# Patient Record
Sex: Male | Born: 1935 | Race: White | Hispanic: No | Marital: Married | Smoking: Former smoker
Health system: Southern US, Community
[De-identification: ages and names within clinical notes are randomized; demographics above are authoritative.]

## PROBLEM LIST (undated history)

## (undated) DIAGNOSIS — R42 Dizziness and giddiness: Secondary | ICD-10-CM

## (undated) DIAGNOSIS — Z720 Tobacco use: Secondary | ICD-10-CM

## (undated) DIAGNOSIS — I472 Ventricular tachycardia: Secondary | ICD-10-CM

## (undated) DIAGNOSIS — I428 Other cardiomyopathies: Secondary | ICD-10-CM

## (undated) DIAGNOSIS — K219 Gastro-esophageal reflux disease without esophagitis: Secondary | ICD-10-CM

## (undated) DIAGNOSIS — F1010 Alcohol abuse, uncomplicated: Secondary | ICD-10-CM

## (undated) DIAGNOSIS — I4729 Other ventricular tachycardia: Secondary | ICD-10-CM

## (undated) DIAGNOSIS — N189 Chronic kidney disease, unspecified: Secondary | ICD-10-CM

## (undated) DIAGNOSIS — I480 Paroxysmal atrial fibrillation: Secondary | ICD-10-CM

## (undated) DIAGNOSIS — I10 Essential (primary) hypertension: Secondary | ICD-10-CM

## (undated) DIAGNOSIS — I5022 Chronic systolic (congestive) heart failure: Secondary | ICD-10-CM

## (undated) DIAGNOSIS — R001 Bradycardia, unspecified: Secondary | ICD-10-CM

## (undated) DIAGNOSIS — J449 Chronic obstructive pulmonary disease, unspecified: Secondary | ICD-10-CM

## (undated) DIAGNOSIS — I2510 Atherosclerotic heart disease of native coronary artery without angina pectoris: Secondary | ICD-10-CM

## (undated) HISTORY — DX: Alcohol abuse, uncomplicated: F10.10

## (undated) HISTORY — PX: CHOLECYSTECTOMY: SHX55

## (undated) HISTORY — DX: Atherosclerotic heart disease of native coronary artery without angina pectoris: I25.10

## (undated) HISTORY — DX: Ventricular tachycardia: I47.2

## (undated) HISTORY — DX: Tobacco use: Z72.0

## (undated) HISTORY — DX: Chronic obstructive pulmonary disease, unspecified: J44.9

## (undated) HISTORY — DX: Dizziness and giddiness: R42

## (undated) HISTORY — DX: Other ventricular tachycardia: I47.29

## (undated) HISTORY — DX: Gastro-esophageal reflux disease without esophagitis: K21.9

## (undated) HISTORY — DX: Essential (primary) hypertension: I10

## (undated) HISTORY — DX: Chronic systolic (congestive) heart failure: I50.22

## (undated) HISTORY — PX: CARDIOVERSION: SHX1299

---

## 1997-06-25 ENCOUNTER — Other Ambulatory Visit: Admission: RE | Admit: 1997-06-25 | Discharge: 1997-06-25 | Payer: Self-pay | Admitting: *Deleted

## 2001-06-01 ENCOUNTER — Encounter (INDEPENDENT_AMBULATORY_CARE_PROVIDER_SITE_OTHER): Payer: Self-pay | Admitting: Specialist

## 2001-06-01 ENCOUNTER — Ambulatory Visit (HOSPITAL_COMMUNITY): Admission: RE | Admit: 2001-06-01 | Discharge: 2001-06-01 | Payer: Self-pay | Admitting: Gastroenterology

## 2007-06-25 ENCOUNTER — Ambulatory Visit: Payer: Self-pay | Admitting: Internal Medicine

## 2007-06-25 ENCOUNTER — Inpatient Hospital Stay (HOSPITAL_COMMUNITY): Admission: EM | Admit: 2007-06-25 | Discharge: 2007-07-05 | Payer: Self-pay | Admitting: Emergency Medicine

## 2007-06-26 ENCOUNTER — Encounter (INDEPENDENT_AMBULATORY_CARE_PROVIDER_SITE_OTHER): Payer: Self-pay | Admitting: Internal Medicine

## 2007-06-26 HISTORY — PX: CARDIAC CATHETERIZATION: SHX172

## 2007-07-02 ENCOUNTER — Encounter: Payer: Self-pay | Admitting: Cardiovascular Disease

## 2007-08-01 ENCOUNTER — Ambulatory Visit (HOSPITAL_COMMUNITY): Admission: RE | Admit: 2007-08-01 | Discharge: 2007-08-01 | Payer: Self-pay | Admitting: Cardiology

## 2007-08-13 HISTORY — PX: INSERT / REPLACE / REMOVE PACEMAKER: SUR710

## 2007-08-29 ENCOUNTER — Ambulatory Visit (HOSPITAL_COMMUNITY): Admission: RE | Admit: 2007-08-29 | Discharge: 2007-08-31 | Payer: Self-pay | Admitting: Cardiology

## 2007-10-01 ENCOUNTER — Ambulatory Visit (HOSPITAL_COMMUNITY): Admission: RE | Admit: 2007-10-01 | Discharge: 2007-10-01 | Payer: Self-pay | Admitting: Cardiology

## 2007-10-10 ENCOUNTER — Encounter: Admission: RE | Admit: 2007-10-10 | Discharge: 2007-10-10 | Payer: Self-pay | Admitting: Cardiology

## 2007-12-21 ENCOUNTER — Ambulatory Visit: Payer: Self-pay | Admitting: Vascular Surgery

## 2008-04-25 ENCOUNTER — Encounter: Admission: RE | Admit: 2008-04-25 | Discharge: 2008-04-25 | Payer: Self-pay | Admitting: Cardiology

## 2008-09-19 IMAGING — CT CT HEAD W/O CM
1 series · 15 of 28 positions shown, 19 images · non-contrast
Comparison: None.

CLINICAL DATA: 83-year-old male with vertigo and unsteady gait
since June 2007.  Headaches.  Pacemaker.

CT HEAD WITHOUT CONTRAST
TECHNIQUE: Contiguous axial images were obtained from the base of
the skull through the vertex without contrast.

[Series 32: 3d filtered head · axial · 0.49mm/px · z∈[+24,+161]mm · 15 of 28 slices shown, 19 images]
[im 2/28  brain]
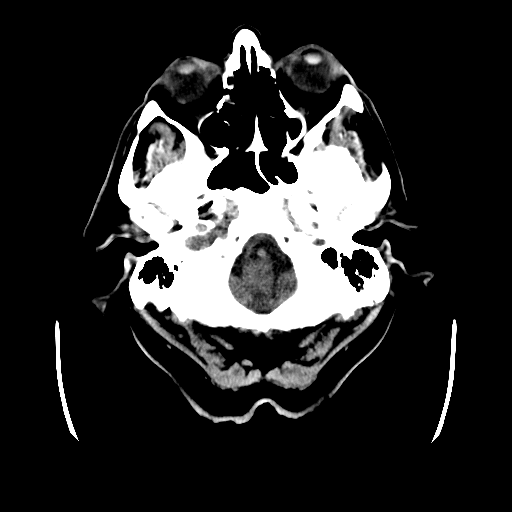
[im 2/28  bone]
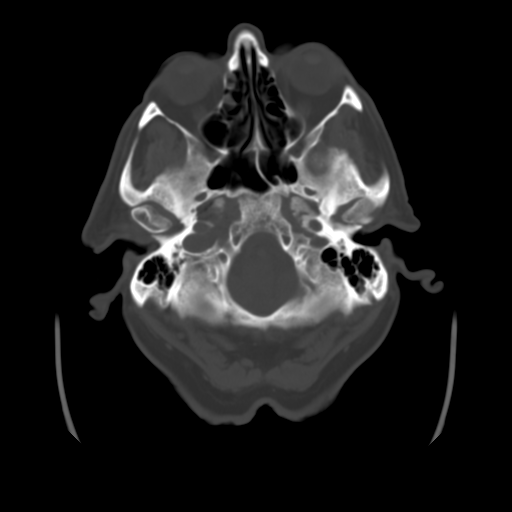
[im 4/28  brain]
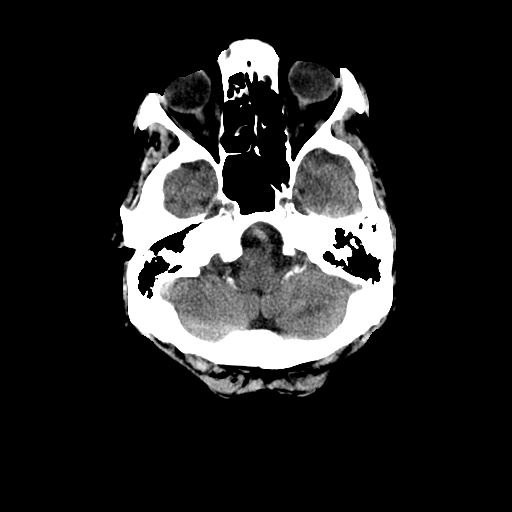
[im 6/28  brain]
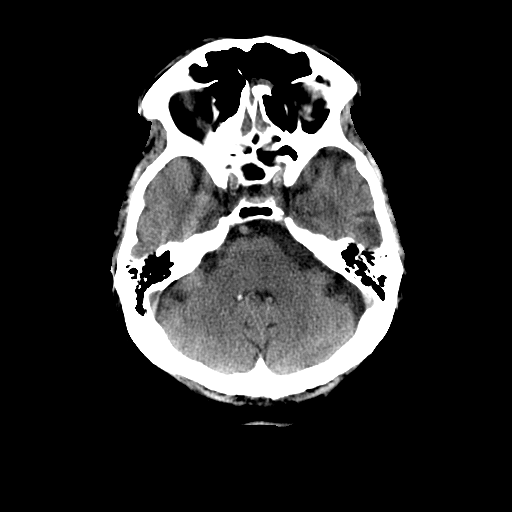
[im 8/28  brain]
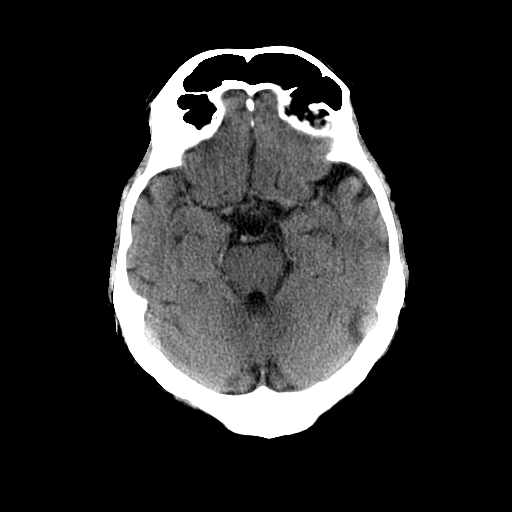
[im 9/28  brain]
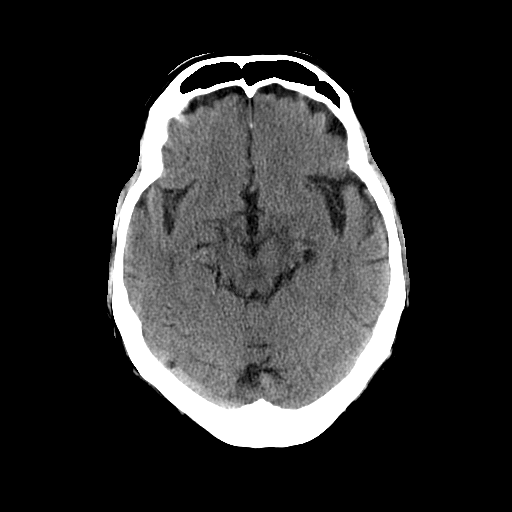
[im 9/28  bone]
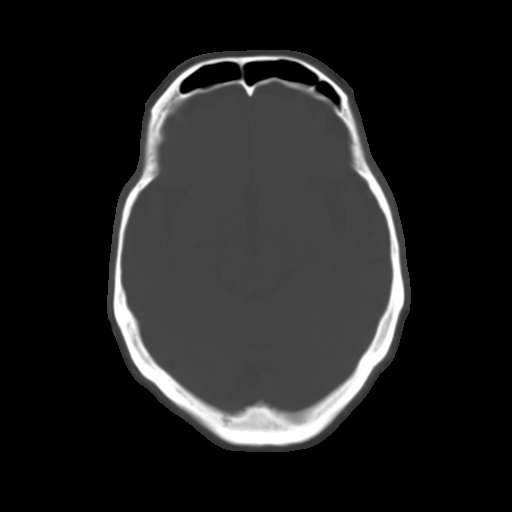
[im 11/28  brain]
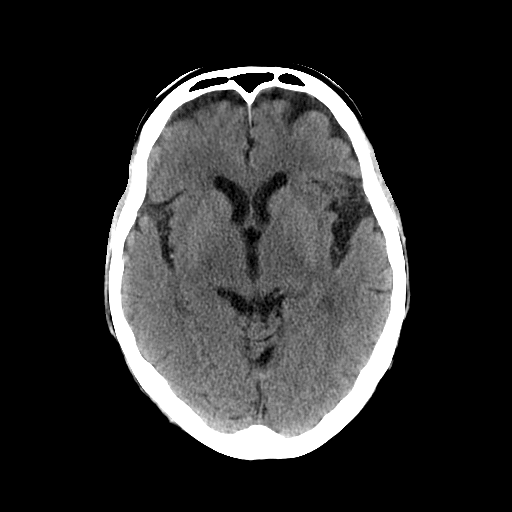
[im 13/28  brain]
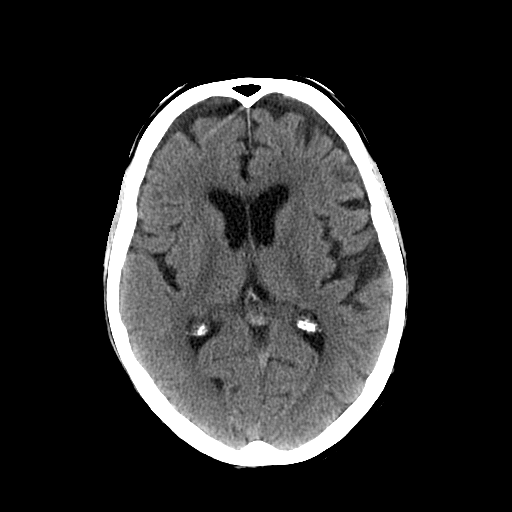
[im 15/28  brain]
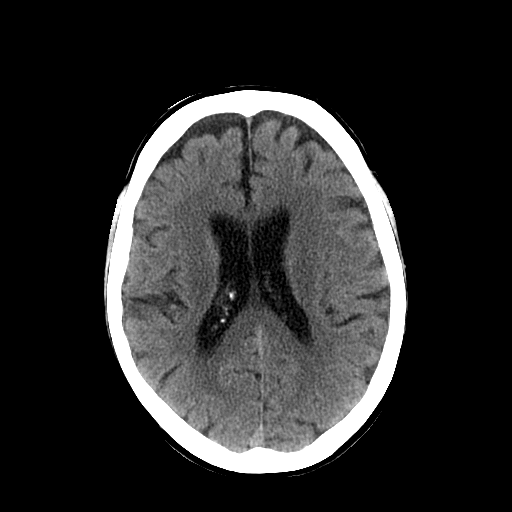
[im 16/28  brain]
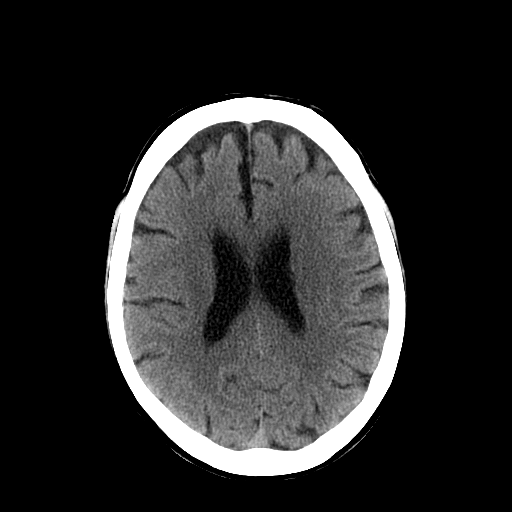
[im 16/28  bone]
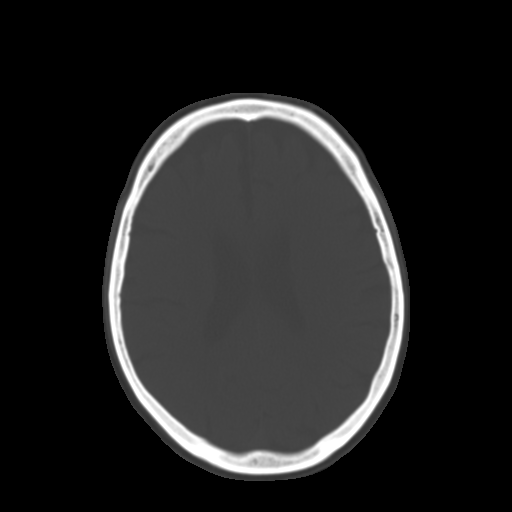
[im 18/28  brain]
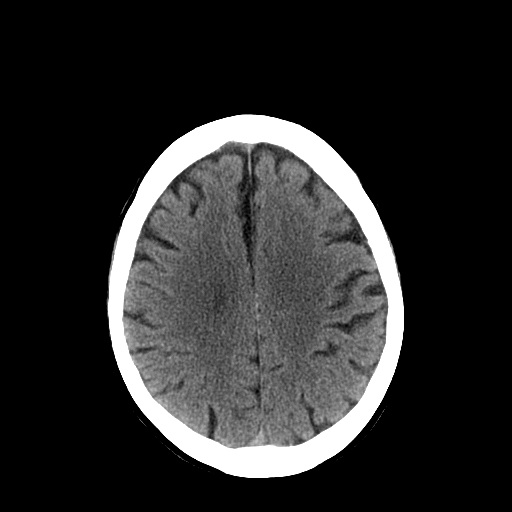
[im 20/28  brain]
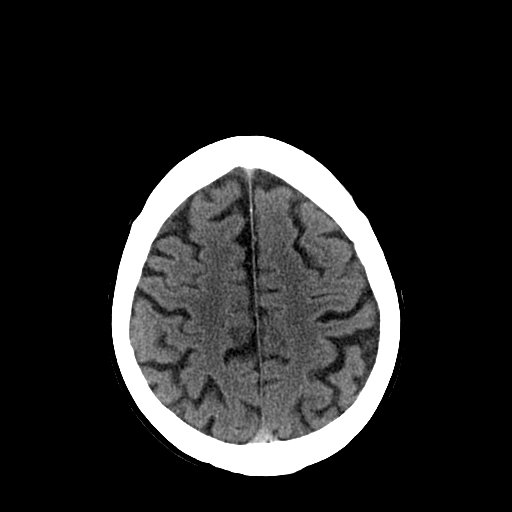
[im 21/28  brain]
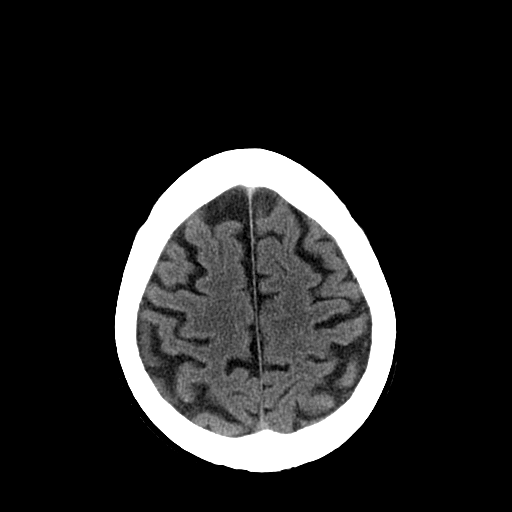
[im 23/28  brain]
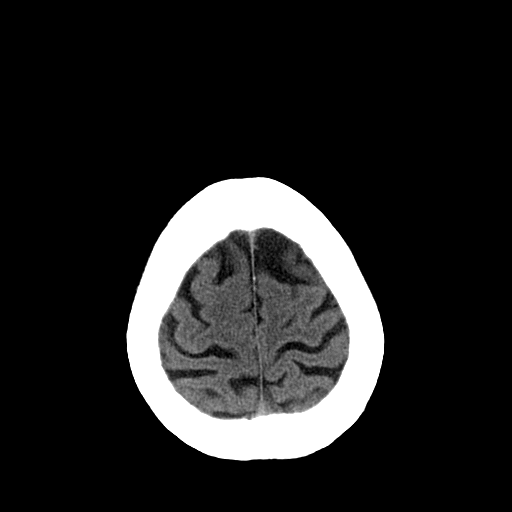
[im 23/28  bone]
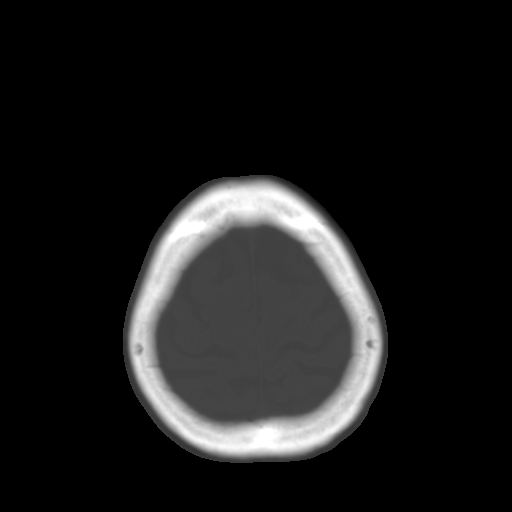
[im 25/28  brain]
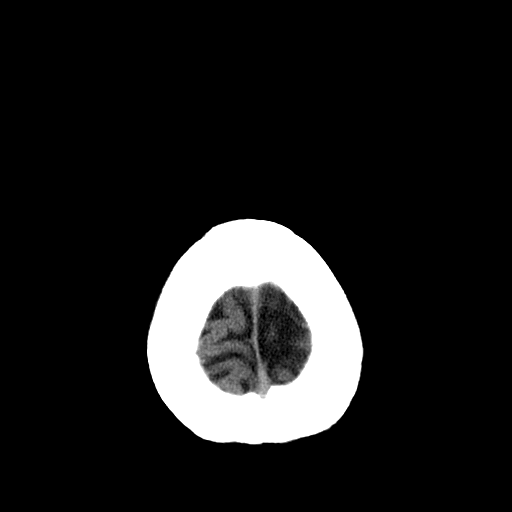
[im 27/28  brain]
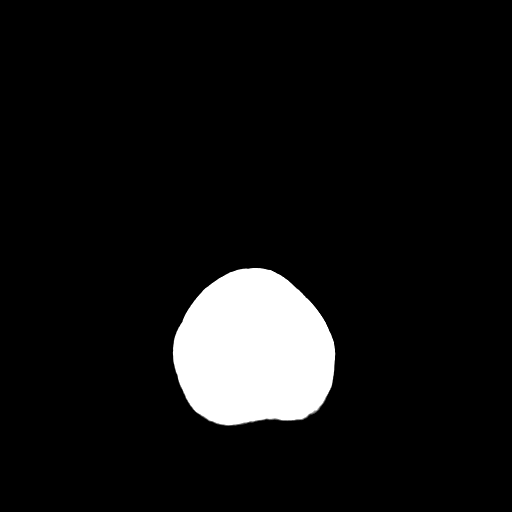

[15 of 28 positions shown; findings below may reference images not displayed]

FINDINGS: Visualized orbits and scalp soft tissues are within
normal limits.  Mild carotid siphon vascular calcifications.
Mucosal thickening in the frontal ethmoidal recesses, left greater
than right.  Scattered mucosal thickening in the visualized
ethmoids.  Sphenoid and visualized maxillary sinuses are clear as
are the visualized mastoid air cells.  No acute osseous
abnormality.

The mild hypodensity in the left pons compatible with lacunar
infarct versus dilated perivascular space.  Mild tortuosity of the
vertebrobasilar junction.  Otherwise normal gray-white matter
differentiation in the brainstem and cerebellum.  Cerebral volume
within normal limits for age.  No midline shift or
ventriculomegaly.  No mass effect. No suspicious intracranial
vascular hyperdensity.   No evidence of acute cortically based
infarct identified.
IMPRESSION: 1. No acute intracranial abnormality.
2.  Evidence of mild small vessel disease with including brain stem
involvement.
3.  Frontal ethmoidal sinus inflammatory changes.

## 2009-01-08 ENCOUNTER — Encounter: Admission: RE | Admit: 2009-01-08 | Discharge: 2009-01-08 | Payer: Self-pay | Admitting: General Surgery

## 2009-01-12 ENCOUNTER — Encounter (INDEPENDENT_AMBULATORY_CARE_PROVIDER_SITE_OTHER): Payer: Self-pay | Admitting: General Surgery

## 2009-01-12 ENCOUNTER — Ambulatory Visit (HOSPITAL_BASED_OUTPATIENT_CLINIC_OR_DEPARTMENT_OTHER): Admission: RE | Admit: 2009-01-12 | Discharge: 2009-01-12 | Payer: Self-pay | Admitting: General Surgery

## 2009-03-14 HISTORY — PX: TRANSTHORACIC ECHOCARDIOGRAM: SHX275

## 2009-03-14 HISTORY — PX: CARDIOVASCULAR STRESS TEST: SHX262

## 2009-04-06 ENCOUNTER — Encounter: Admission: RE | Admit: 2009-04-06 | Discharge: 2009-04-06 | Payer: Self-pay | Admitting: Cardiology

## 2009-12-19 ENCOUNTER — Encounter: Payer: Self-pay | Admitting: Internal Medicine

## 2010-01-04 ENCOUNTER — Ambulatory Visit: Payer: Self-pay | Admitting: Internal Medicine

## 2010-03-30 ENCOUNTER — Encounter: Payer: Self-pay | Admitting: Internal Medicine

## 2010-04-09 ENCOUNTER — Encounter: Payer: Self-pay | Admitting: Internal Medicine

## 2010-04-09 ENCOUNTER — Ambulatory Visit
Admission: RE | Admit: 2010-04-09 | Discharge: 2010-04-09 | Payer: Self-pay | Source: Home / Self Care | Attending: Internal Medicine | Admitting: Internal Medicine

## 2010-04-09 DIAGNOSIS — I10 Essential (primary) hypertension: Secondary | ICD-10-CM | POA: Insufficient documentation

## 2010-04-09 DIAGNOSIS — I472 Ventricular tachycardia: Secondary | ICD-10-CM | POA: Insufficient documentation

## 2010-04-09 DIAGNOSIS — I4891 Unspecified atrial fibrillation: Secondary | ICD-10-CM | POA: Insufficient documentation

## 2010-04-09 DIAGNOSIS — I5022 Chronic systolic (congestive) heart failure: Secondary | ICD-10-CM | POA: Insufficient documentation

## 2010-04-09 DIAGNOSIS — I495 Sick sinus syndrome: Secondary | ICD-10-CM | POA: Insufficient documentation

## 2010-04-13 NOTE — Miscellaneous (Signed)
Summary: Device preload  Clinical Lists Changes  Observations: Added new observation of PPM INDICATN: Sick sinus syndrome (12/19/2009 15:17) Added new observation of MAGNET RTE: BOL 85 ERI 65 (12/19/2009 15:17) Added new observation of PPMLEADSTAT2: active (12/19/2009 15:17) Added new observation of PPMLEADSER2: 045409  (12/19/2009 15:17) Added new observation of PPMLEADMOD2: 4470  (12/19/2009 15:17) Added new observation of PPMLEADDOI2: 08/29/2007  (12/19/2009 15:17) Added new observation of PPMLEADLOC2: RV  (12/19/2009 15:17) Added new observation of PPMLEADSTAT1: active  (12/19/2009 15:17) Added new observation of PPMLEADSER1: 811914  (12/19/2009 15:17) Added new observation of PPMLEADMOD1: 4469  (12/19/2009 15:17) Added new observation of PPMLEADDOI1: 08/29/2007  (12/19/2009 15:17) Added new observation of PPMLEADLOC1: RA  (12/19/2009 15:17) Added new observation of PPM IMP MD: Duffy Rhody Tennant,MD  (12/19/2009 15:17) Added new observation of PPM DOI: 08/29/2007  (12/19/2009 15:17) Added new observation of PPM SERL#: [id number]  (12/19/2009 15:17) Added new observation of PPM MODL#: [id number]  (12/19/2009 15:17) Added new observation of PACEMAKERMFG: [employer]onic  (12/19/2009 15:17) Added new observation of PPM REFER MD: Roger Shelter, MD  (12/19/2009 15:17) Added new observation of PACEMAKER MD: Hillis Range, MD  (12/19/2009 15:17)      PPM Specifications Following MD:  Hillis Range, MD     Referring MD:  Roger Shelter, MD PPM Vendor:  [employer]tronic     PPM Model Number:  [id number]     PPM Serial Number:  [id number] PPM DOI:  08/29/2007     PPM Implanting MD:  Rolla Plate  Lead 1    Location: RA     DOI: 08/29/2007     Model #: 4469     Serial #: [id number]     Status: active Lead 2    Location: RV     DOI: 08/29/2007     Model #: 4470     Serial #: [id number]     Status: active  Magnet Response Rate:  BOL 85 ERI 65  Indications:  Sick sinus syndrome

## 2010-04-13 NOTE — Cardiovascular Report (Signed)
Summary: Office Visit   Office Visit   Imported By: Roderic Ovens 01/11/2010 16:05:12  _____________________________________________________________________  External Attachment:    Type:   Image     Comment:   External Document

## 2010-04-13 NOTE — Procedures (Signed)
Summary: pacer check/medtronic   Current Medications (verified): 1)  Aspirin 81 Mg Tbec (Aspirin) .... One By Mouth Daily 2)  Allopurinol 300 Mg Tabs (Allopurinol) .... One Half By Mouth Daily 3)  Amiodarone Hcl 200 Mg Tabs (Amiodarone Hcl) .... One By Mouth Daily 4)  Meclizine Hcl 25 Mg Tabs (Meclizine Hcl) .... One By Mouth Three Times A Day As Needed 5)  Furosemide 20 Mg Tabs (Furosemide) .... One By Mouth Daily 6)  Benazepril Hcl 20 Mg Tabs (Benazepril Hcl) .... One Half By Mouth Daily  Allergies (verified): No Known Drug Allergies  PPM Specifications Following MD:  Hillis Range, MD     Referring MD:  Roger Shelter, MD PPM Vendor:  Med[employer]onic     PPM Model Number:  [id number]     PPM Serial Number:  [id number] H PPM DOI:  08/29/2007     PPM Implanting MD:  Rolla Plate  Lead 1    Location: RA     DOI: 08/29/2007     Model #: 4469     Serial #: [id number]     Status: active Lead 2    Location: RV     DOI: 08/29/2007     Model #: 4470     Serial #: [id number]     Status: active  Magnet Response Rate:  BOL 85 ERI 65  Indications:  Sick sinus syndrome   PPM Follow Up Remote Check?  No Battery Voltage:  3.00 V     Pacer Dependent:  No       PPM Device Measurements Atrium  Amplitude: 1.5 mV, Impedance: 448 ohms, Threshold: 0.5 V at 0.4 msec Right Ventricle  Amplitude: 9.0 mV, Impedance: 416 ohms, Threshold: 0.5 V at 0.4 msec  Episodes MS Episodes:  0     Percent Mode Switch:  0     Coumadin:  No Ventricular High Rate:  0     Atrial Pacing:  96.4%     Ventricular Pacing:  2.3%  Parameters Mode:  DDDR+     Lower Rate Limit:  60     Upper Rate Limit:  130 Paced AV Delay:  200     Sensed AV Delay:  180 Next Cardiology Appt Due:  03/14/2010 Tech Comments:  No parameter changes.  Device function normal.  New [place] [facility] patient.  ROV 3 months with Dr. Johney Frame.  We will set up [facility] @ that visit. Altha Harm, LPN  January 04, 2010 2:58 PM

## 2010-04-15 NOTE — Assessment & Plan Note (Signed)
Summary: device/saf   Visit Type:  Pacemaker check Referring Provider:  Dr Deborah Chalk Primary Provider:  Dr Valentina Lucks   History of Present Illness: Mark Hess is a pleasant 77 yo WM with a h/o persistent atrial fibrillation s/p repeat cardioversions, bradycardia s/p PPM, nonischemic CM (EF 35-40%) and HTN who presents today to initiate care in the EP clinic.  He is sp MDT PPM 2009 for symptomatic bradycardia.  He appears to be doing reasonably well from a cardiac standpoint.  He has occasional episodes of headaches with "head fullness" and dizziness.  These episodes occur in the evenings and resolve with meclizine.  He is otherwise doing well.  He denies symptoms of palpitations, chest pain, shortness of breath, orthopnea, PND, lower extremity edema, presyncope, syncope, or neurologic sequela. The patient is tolerating medications without difficulties and is otherwise without complaint today.   Current Medications (verified): 1)  Aspirin 81 Mg Tbec (Aspirin) .... One By Mouth Daily 2)  Allopurinol 300 Mg Tabs (Allopurinol) .... One Half By Mouth Daily 3)  Amiodarone Hcl 200 Mg Tabs (Amiodarone Hcl) .... One By Mouth Daily 4)  Meclizine Hcl 25 Mg Tabs (Meclizine Hcl) .... One By Mouth Three Times A Day As Needed 5)  Furosemide 20 Mg Tabs (Furosemide) .... One By Mouth Daily 6)  Benazepril Hcl 20 Mg Tabs (Benazepril Hcl) .... One Half By Mouth Daily  Allergies (verified): No Known Drug Allergies  Past History:  Past Medical History: Atrial Fibrillation C O P D Diabetes Type 2 Hypertension nonischemic CM (EF 35-40%) NYHA CLass II CHF bradycardia s/p PPM  Past Surgical History: sp PPM (MDT) by Dr Deborah Chalk 08/29/07  Social History: Reviewed history from 04/09/2010 and no changes required. Married  Tobacco Use - Yes.  Alcohol Use - yes -- occasional Drug Use - no  Review of Systems       All systems are reviewed and negative except as listed in the HPI.   Vital Signs:  Patient  profile:   77 year old male Height:      69 inches Weight:      149 pounds BMI:     22.08 Pulse rate:   79 / minute BP sitting:   142 / 80  (left arm) Cuff size:   regular  Vitals Entered By: Mark Hess, RMA (April 09, 2010 10:37 AM)  Physical Exam  General:  elderly, NAD Head:  normocephalic and atraumatic Eyes:  PERRLA/EOM intact; conjunctiva and lids normal. Mouth:  Teeth, gums and palate normal. Oral mucosa normal. Neck:  supple Chest Wall:  R sided pacemaker is well healed Lungs:  Clear bilaterally to auscultation and percussion. Heart:  Non-displaced PMI, chest non-tender; regular rate and rhythm, S1, S2 without murmurs, rubs or gallops. Carotid upstroke normal, no bruit. Normal abdominal aortic size, no bruits. Femorals normal pulses, no bruits. Pedals normal pulses. No edema, no varicosities. Abdomen:  Bowel sounds positive; abdomen soft and non-tender without masses, organomegaly, or hernias noted. No hepatosplenomegaly. Msk:  Back normal, normal gait. Muscle strength and tone normal. Extremities:  No clubbing or cyanosis. Neurologic:  Alert and oriented x 3.   PPM Specifications Following MD:  Mark Range, MD     Referring MD:  Mark Shelter, MD PPM Vendor:  [employer]onic     PPM Model Number:  [id number]     PPM Serial Number:  [id number] H PPM DOI:  08/29/2007     PPM Implanting MD:  Mark Rhody Tennant,MD  Lead 1    Location: RA  DOI: 08/29/2007     Model #: 1610     Serial #: [id number]     Status: active Lead 2    Location: RV     DOI: 08/29/2007     Model #: 4470     Serial #: [id number]     Status: active  Magnet Response Rate:  BOL 85 ERI 65  Indications:  Sick sinus syndrome   PPM Follow Up Battery Voltage:  2.99 V     Pacer Dependent:  No       PPM Device Measurements Atrium  Amplitude: 1.8 mV, Impedance: 520 ohms, Threshold: 1.0 V at 0.4 msec Right Ventricle  Amplitude: 9.7 mV, Impedance: 424 ohms, Threshold: 1.0 V at 0.4 msec  Episodes MS Episodes:  0      Coumadin:  No Ventricular High Rate:  3     Atrial Pacing:  96.9%     Ventricular Pacing:  1.7%  Parameters Mode:  MVP     Lower Rate Limit:  60     Upper Rate Limit:  130 Paced AV Delay:  200     Sensed AV Delay:  180 Next Remote Date:  07/08/2010     Next Cardiology Appt Due:  03/15/2011 Tech Comments:  3 MONITORED VT EPISODES--LONGEST WAS 10 BEATS.  NORMAL DEVICE FUNCTION.  HISTOGRAM APPROPRIATE FOR PT--PT HAS NO COMPLAINTS W/FATIGUE OR SOB.  NO CHANGES MADE. [facility] 07-08-10 AND ROV IN 12 MTHS W/JA. Mark Hess  April 09, 2010 10:41 AM MD Comments:  agree nonsustained VT but no sustained arrhythmias normal pacemaker function  Impression & Recommendations:  Problem # 1:  ATRIAL FIBRILLATION (ICD-427.31)  controlled with amiodarone his CHADS2 score is 66 (age, CHF, HTN) he should be on coumadin if not contraindicated for stroke prevention I will defer this decision to Dr Deborah Chalk.  His updated medication list for this problem includes:    Aspirin 81 Mg Tbec (Aspirin) ..... One by mouth daily    Amiodarone Hcl 200 Mg Tabs (Amiodarone hcl) ..... One by mouth daily  Problem # 2:  HYPERTENSION, UNSPECIFIED (ICD-401.9)  stable no changes today  His updated medication list for this problem includes:    Aspirin 81 Mg Tbec (Aspirin) ..... One by mouth daily    Furosemide 20 Mg Tabs (Furosemide) ..... One by mouth daily    Benazepril Hcl 20 Mg Tabs (Benazepril hcl) ..... One half by mouth daily  Problem # 3:  CHRONIC SYSTOLIC HEART FAILURE (ICD-428.22)  stable without significant CHF no changes given advanced age, he is not a candidate for ICD for primary prevention of sudden death  His updated medication list for this problem includes:    Aspirin 81 Mg Tbec (Aspirin) ..... One by mouth daily    Amiodarone Hcl 200 Mg Tabs (Amiodarone hcl) ..... One by mouth daily    Furosemide 20 Mg Tabs (Furosemide) ..... One by mouth daily    Benazepril Hcl 20 Mg Tabs (Benazepril hcl)  ..... One half by mouth daily  Problem # 4:  SINOATRIAL NODE DYSFUNCTION (ICD-427.81)  normal pacemaker function no changes today  dizziness and "head fullness" are not likely cardiac in etiology He will follow-up with PCP regarding these symptoms.  His updated medication list for this problem includes:    Aspirin 81 Mg Tbec (Aspirin) ..... One by mouth daily    Amiodarone Hcl 200 Mg Tabs (Amiodarone hcl) ..... One by mouth daily    Benazepril Hcl 20 Mg Tabs (Benazepril hcl) ..... One half  by mouth daily  Patient Instructions: 1)  Your physician wants you to follow-up in: 12 months with Dr Mark Hess will receive a reminder letter in the mail two months in advance. If you don't receive a letter, please call our office to schedule the follow-up appointment. 2)  Your physician recommends that you continue on your current medications as directed. Please refer to the Current Medication list given to you today.

## 2010-04-21 NOTE — Cardiovascular Report (Signed)
Summary: Office Visit   Office Visit   Imported By: Roderic Ovens 04/14/2010 15:14:56  _____________________________________________________________________  External Attachment:    Type:   Image     Comment:   External Document

## 2010-04-29 NOTE — Letter (Signed)
Summary: [facility] Progress Note   [facility] Progress Note   Imported By: Roderic Ovens 04/23/2010 15:16:45  _____________________________________________________________________  External Attachment:    Type:   Image     Comment:   External Document

## 2010-05-17 ENCOUNTER — Emergency Department (HOSPITAL_COMMUNITY): Payer: Medicare Other

## 2010-05-17 ENCOUNTER — Observation Stay (HOSPITAL_COMMUNITY)
Admission: EM | Admit: 2010-05-17 | Discharge: 2010-05-18 | DRG: 552 | Disposition: A | Discharge status: Home / Self Care | Payer: Medicare Other | Attending: Internal Medicine | Admitting: Internal Medicine

## 2010-05-17 DIAGNOSIS — I2510 Atherosclerotic heart disease of native coronary artery without angina pectoris: Secondary | ICD-10-CM | POA: Insufficient documentation

## 2010-05-17 DIAGNOSIS — I10 Essential (primary) hypertension: Secondary | ICD-10-CM | POA: Insufficient documentation

## 2010-05-17 DIAGNOSIS — E119 Type 2 diabetes mellitus without complications: Secondary | ICD-10-CM | POA: Insufficient documentation

## 2010-05-17 DIAGNOSIS — I252 Old myocardial infarction: Secondary | ICD-10-CM | POA: Insufficient documentation

## 2010-05-17 DIAGNOSIS — F17200 Nicotine dependence, unspecified, uncomplicated: Secondary | ICD-10-CM | POA: Insufficient documentation

## 2010-05-17 DIAGNOSIS — M531 Cervicobrachial syndrome: Principal | ICD-10-CM | POA: Insufficient documentation

## 2010-05-17 LAB — BASIC METABOLIC PANEL
BUN: 36 mg/dL — ABNORMAL HIGH (ref 6–23)
Calcium: 9.1 mg/dL (ref 8.4–10.5)
GFR calc non Af Amer: 24 mL/min — ABNORMAL LOW (ref >60)
Glucose, Bld: 126 mg/dL — ABNORMAL HIGH (ref 70–99)
Sodium: 135 mEq/L (ref 135–145)

## 2010-05-17 LAB — CK TOTAL AND CKMB (NOT AT ARMC)
CK, MB: 3.8 ng/mL (ref 0.3–4.0)
Total CK: 40 U/L (ref 7–232)

## 2010-05-17 LAB — POCT I-STAT, CHEM 8
BUN: 40 mg/dL — ABNORMAL HIGH (ref 6–23)
Calcium, Ion: 1.23 mmol/L (ref 1.12–1.32)
Chloride: 113 mEq/L — ABNORMAL HIGH (ref 96–112)
Creatinine, Ser: 2.8 mg/dL — ABNORMAL HIGH (ref 0.4–1.5)
Glucose, Bld: 118 mg/dL — ABNORMAL HIGH (ref 70–99)
Potassium: 4.6 mEq/L (ref 3.5–5.1)

## 2010-05-17 LAB — CBC
Hemoglobin: 15.1 g/dL (ref 13.0–17.0)
MCHC: 32.8 g/dL (ref 30.0–36.0)

## 2010-05-17 LAB — DIFFERENTIAL
Basophils Absolute: 0.1 10*3/uL (ref 0.0–0.1)
Basophils Relative: 1 % (ref 0–1)
Neutro Abs: 5.8 10*3/uL (ref 1.7–7.7)
Neutrophils Relative %: 62 % (ref 43–77)

## 2010-05-17 LAB — POCT CARDIAC MARKERS: Troponin i, poc: <0.05 ng/mL (ref 0.00–0.09)

## 2010-05-17 LAB — TROPONIN I: Troponin I: 0.03 ng/mL (ref 0.00–0.06)

## 2010-05-18 LAB — CARDIAC PANEL(CRET KIN+CKTOT+MB+TROPI)
CK, MB: 3.6 ng/mL (ref 0.3–4.0)
Relative Index: INVALID (ref 0.0–2.5)
Total CK: 47 U/L (ref 7–232)
Troponin I: 0.03 ng/mL (ref 0.00–0.06)

## 2010-05-18 LAB — TSH: TSH: 1.852 u[IU]/mL (ref 0.350–4.500)

## 2010-05-18 LAB — GLUCOSE, CAPILLARY: Glucose-Capillary: 113 mg/dL — ABNORMAL HIGH (ref 70–99)

## 2010-05-21 NOTE — Discharge Summary (Signed)
  Mark Hess, DASCH             ACCOUNT NO.:  [id number]  MEDICAL RECORD NO.:  [id number]           PATIENT TYPE:  I  LOCATION:  2020                         FACILITY:  [facility]  PHYSICIAN:  Thora Lance, M.D.  DATE OF BIRTH:  1923/10/21  DATE OF ADMISSION:  05/17/2010 DATE OF DISCHARGE:  05/18/2010                              DISCHARGE SUMMARY   REASON FOR ADMISSION:  This is an 77 year old white male with history of diabetes, coronary artery disease and prior MI, and hypertension who presented with 1-day episode of severe burning in his left lateral neck radiating up behind his left ear towards the left head.  Each episode lasted about 20 minutes, but some were short of 2 minutes.  The pain was fairly severe.  He has had no fevers, chills, headache, sore throat, shortness of breath, or chest pain.  Workup in the ER consisted of negative chest x-ray, negative CT scan of the head and neck.  EKG was normal.  SIGNIFICANT FINDINGS:  VITAL SIGNS:  Blood pressure 140/80, heart rate 60, respirations 22, temperature 97.6. LUNGS:  Clear. HEART:  Regular rate and rhythm without murmur, gallop, or rub. ABDOMEN:  Benign. NEUROLOGIC:  Nonfocal.  OBJECTIVE FINDINGS:  CT scan of the head showed no acute intracranial findings.  CT scan of the cervical spine showed no fracture.  Cardiac enzymes were normal.  WBC 9.4, hemoglobin 15.1, platelet count 224,000. Sodium 142, potassium 4.6, creatinine 2.8, BUN 40.  Chest x-ray clear.  HOSPITAL COURSE:  The patient was admitted with acute left neck pain radiating to the left head.  He had cardiac enzymes which remained normal, indicating this was not an acute coronary syndrome.  He most likely has acute left neck pain, possible occipital neuralgia, rule out early herpes zoster.  He was given 60 mg of prednisone.  He will be seen again in my office in 1 day.  His creatinine was elevated at 2.8 which is higher than his baseline.  Benazepril and  furosemide were held.  He was asked to drink plenty of fluids in the next 24 hours and to avoid any nonsteroidal agents.  DISCHARGE DIAGNOSES: 1. Occipital neuralgia. 2. Neck pain. 3. Coronary artery disease. 4. Hypertension. 5. Diabetes mellitus, diet-controlled.  PROCEDURES: 1. CT scan of the brain. 2. CT scan of the neck.  DISCHARGE MEDICATIONS: 1. Zolpidem 5 mg one p.o. at bedtime. 2. Allopurinol 300 mg one-half tablet once a day. 3. Amiodarone 200 mg 1 tablet once in the a.m. 4. Aspirin 81 mg 1 tablet once in the morning. 5. Meclizine 25 mg 1 tablet t.i.d. p.r.n. 6. Furosemide and benazepril held at discharge.  DISPOSITION:  Discharged to home.  FOLLOWUP:  Follow up in 1 day with Dr. Valentina Lucks.  ACTIVITY:  As tolerated.  CODE STATUS:  Full code.  DIET:  Low-sodium diet.          ______________________________ Thora Lance, M.D.     JJG/MEDQ  D:  05/18/2010  T:  05/18/2010  Job:  [id number]  Electronically Signed by Kirby Funk M.D. on 05/21/2010 06:27:47 PM

## 2010-06-02 NOTE — H&P (Signed)
NAMEJERON, Mark Hess             ACCOUNT NO.:  [id number]  MEDICAL RECORD NO.:  [id number]           PATIENT TYPE:  E  LOCATION:  [facility]                         FACILITY:  [facility]  PHYSICIAN:  Houston Siren, MD           DATE OF BIRTH:  11-26-1923  DATE OF ADMISSION:  05/17/2010 DATE OF DISCHARGE:                             HISTORY & PHYSICAL   PRIMARY CARE PHYSICIAN:  Thora Lance, MD  CARDIOLOGIST:  Colleen Can. Deborah Chalk, MD  REASON FOR ADMISSION:  Left neck pain  ADVANCED DIRECTIVE:   FULL CODE  HISTORY OF PRESENT ILLNESS:  This is an 77 year old male with history of diabetes, coronary artery disease, prior MI, hypertension, and unfortunately active tobacco use, presents with several episodes of severe burning pain behind his left ear radiating towards his left head. He stated each episode lasted about 20 minutes and some as short as 2 minutes.  He had one episode of diaphoresis and slight nausea because of the pain.  He stated the pain is severe and burning in nature.  It is very similar to the pain he had with zoster except more severe.  He has no watery eyes, headache, or sore throat.  He denied any fever, chills, or any stiff neck.  He has no shortness of breath or chest pain.  Workup in emergency room included a negative chest x-ray, a negative CT of the head and neck.  Labs show a normal white count of 9.4 thousand, hemoglobin of 15.1, troponin less than 0.05.  His creatinine is 2.8, this is not new reportedly.  His EKG shows no acute ST-T changes.  There was concern that this might be acute coronary syndrome or anginal equivalent, and hospitalist was asked to admit the patient for rule out.  PAST MEDICAL HISTORY: 1. Coronary artery disease. 2. Hypertension. 3. Diabetes. 4. Myocardial infarction.  SOCIAL HISTORY:  He does not use tobacco, alcohol, or drugs.  ALLERGIES:  No known drug allergies.  MEDICATIONS:  He is unable to elaborate.  REVIEW OF SYSTEMS:   Otherwise unremarkable.  PHYSICAL EXAMINATION:  VITAL SIGNS:  Blood pressure 140/80, heart rate of 60, respiratory rate of 22, temperature 97.6. GENERAL:  He is alert and oriented and is in no apparent distress. HEENT AND NECK:  No facial asymmetry, and his speech is fluent.  Tongue is midline.  Uvula elevated with phonation.  Palpation of his left neck revealed no tenderness.  He has no rash.  Sclerae are nonicteric.  Neck is supple. CARDIAC:  S1 and S2 regular.  I did not hear any murmur, rub, or gallop. LUNGS:  Clear. ABDOMEN:  Slightly obese, nondistended, nontender. EXTREMITIES:  No edema.  Homans sign is negative.  He has good distal pulses bilaterally. SKIN:  Warm and dry. NEUROLOGIC:  Nonfocal.  He has good strength bilaterally.  OBJECTIVE FINDINGS:  CT of the head shows no acute intracranial finding. There is mild right maxillary sinusitis. The neck CT showed no evidence of cervical spine fracture. Troponin less than 0.05.  White count of 9.4 thousand, hemoglobin of 15.1, and platelet count 224,000.  Serum sodium  142, potassium 4.6, creatinine 2.8, BUN of 40. Chest x-ray is clear. EKG shows paced rhythm without any acute ST-T changes.  IMPRESSION:  This is an 77 year old male with no coronary artery disease presenting with left severe burning neck pain that is intermittent.  I suspect that this is a neuropathic pain as in tic [place].  I doubt that this is acute coronary syndrome or even anginal equivalent. Because of his known coronary artery disease, it is reasonable to admit him for rule out.  We will get serial CPKs and troponins.  I will use Dilaudid for his pain, but he is comfortable at this point.  The other possibility would be early zoster but I see no rash.  We will admit him to Dr. Jone Baseman service.  He is a full code.  We will hold off on steroids or other neuropathic pain medications such as Tegretol, tricyclic, etc., for now in hope that his pain will  resolve by itself.     Houston Siren, MD     PL/MEDQ  D:  05/17/2010  T:  05/17/2010  Job:  [id number]  cc:   Thora Lance, M.D.  Electronically Signed by Houston Siren  on 06/02/2010 01:46:56 AM

## 2010-06-17 LAB — DIFFERENTIAL
Eosinophils Absolute: 0.2 10*3/uL (ref 0.0–0.7)
Eosinophils Relative: 2 % (ref 0–5)
Lymphs Abs: 1.8 10*3/uL (ref 0.7–4.0)
Monocytes Relative: 5 % (ref 3–12)

## 2010-06-17 LAB — COMPREHENSIVE METABOLIC PANEL
ALT: 25 U/L (ref 0–53)
AST: 25 U/L (ref 0–37)
CO2: 26 mEq/L (ref 19–32)
Calcium: 9.6 mg/dL (ref 8.4–10.5)
GFR calc Af Amer: 38 mL/min — ABNORMAL LOW (ref >60)
GFR calc non Af Amer: 32 mL/min — ABNORMAL LOW (ref >60)
Sodium: 140 mEq/L (ref 135–145)

## 2010-06-17 LAB — CBC
MCHC: 33.2 g/dL (ref 30.0–36.0)
RBC: 4.85 MIL/uL (ref 4.22–5.81)
WBC: 7.3 10*3/uL (ref 4.0–10.5)

## 2010-07-08 ENCOUNTER — Encounter: Payer: Self-pay | Admitting: *Deleted

## 2010-07-11 ENCOUNTER — Encounter: Payer: Self-pay | Admitting: *Deleted

## 2010-07-12 ENCOUNTER — Other Ambulatory Visit: Payer: Self-pay | Admitting: *Deleted

## 2010-07-12 DIAGNOSIS — R42 Dizziness and giddiness: Secondary | ICD-10-CM

## 2010-07-12 MED ORDER — MECLIZINE HCL 25 MG PO TABS: 25.0000 mg | ORAL_TABLET | Freq: Three times a day (TID) | ORAL | Status: DC | PRN

## 2010-07-14 ENCOUNTER — Other Ambulatory Visit: Payer: Self-pay | Admitting: Internal Medicine

## 2010-07-15 ENCOUNTER — Ambulatory Visit (INDEPENDENT_AMBULATORY_CARE_PROVIDER_SITE_OTHER): Payer: Medicare Other | Admitting: *Deleted

## 2010-07-15 DIAGNOSIS — I495 Sick sinus syndrome: Secondary | ICD-10-CM

## 2010-07-15 DIAGNOSIS — I4891 Unspecified atrial fibrillation: Secondary | ICD-10-CM

## 2010-07-15 DIAGNOSIS — I472 Ventricular tachycardia: Secondary | ICD-10-CM

## 2010-07-21 ENCOUNTER — Encounter: Payer: Self-pay | Admitting: *Deleted

## 2010-07-22 NOTE — Progress Notes (Signed)
Pacer remote check  

## 2010-07-27 NOTE — H&P (Signed)
Mark Hess, Mark Hess             ACCOUNT NO.:  [id number]   MEDICAL RECORD NO.:  [id number]          PATIENT TYPE:  OIB   LOCATION:  2899                         FACILITY:  [facility]   PHYSICIAN:  Colleen Can. Deborah Chalk, M.D.DATE OF BIRTH:  [birth year]   DATE OF ADMISSION:  08/29/2007  DATE OF DISCHARGE:                              HISTORY & PHYSICAL   CHIEF COMPLAINT:  Palpitations in the setting of bradycardia.   HISTORY OF PRESENT ILLNESS:  Mr. 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  He has had LV dysfunction, as well as atrial  fibrillation.  He underwent a successful cardioversion on Aug 01, 2007.  Since that time he has continued to have complaints of palpitations, as  well as PVCs and PACs.  He has also continued to have significant  dizziness, has been associated with decrease in blood pressure.  Medicines have been very difficult to be titrated in light of his  bradycardia and he now presents for dual-chamber pacemaker implantation.   PAST MEDICAL HISTORY:  1. Lengthy hospitalization in April 2009, for nonischemic      cardiomyopathy.  He underwent cardiac catheterization at that time      that showed mild coronary disease with severe global LV      dysfunction.  2. History of nonsustained ventricular tachycardia in the past, he has      had a shock vest in place.  3. LV systolic dysfunction.  His ejection fraction is currently 30-      40%.  4. Past atrial fibrillation status post successful cardioversion, Aug 01, 2007.  5. History of alcohol abuse.  6. Coumadin anticoagulation.  7. Diabetes.  8. Hypertension.  9. Ongoing tobacco abuse.  10.Positive family history of heart disease.  11.Chronic obstructive pulmonary disease.  12.Questionable claudication.  13.History of cholecystectomy.  14.Anxiety.   ALLERGIES:  None.   CURRENT MEDICINES:  1. Benazepril 20 mg half a tablet daily.  2.  Lasix 20 mg every other day.  3. Coumadin 5 mg 6 days a week 2.5 mg x1.  4. Ambien 10 mg at bedtime.  5. Xanax p.r.n.Marland Kitchen   FAMILY HISTORY:  Unchanged.   SOCIAL HISTORY:  Unchanged.   REVIEW OF SYSTEMS:  He has been seen back in the office several times  since his cardioversion and continues to have lightheadedness and  dizziness in the setting of bradycardia with PACs and PVCs.  Medicines  have been decreased to no avail.  His shock test has been discontinued.  He has had no frank syncope.   PHYSICAL EXAM:  GENERAL:  He is anxious white male.  VITAL SIGNS:  His blood pressure is 112/68 sitting, 116/70 standing,  weight is 148.5 pounds, heart rate is 56, and regular, respirations 80,  he is afebrile.  SKIN:  Warm and dry.  Color is suntanned.  LUNGS:  Decreased breath sounds.  CARDIAC EXAM:  Regular rhythm.  ABDOMEN: Soft, positive bowel sounds, nontender.  EXTREMITIES:  Without edema.  NEUROLOGIC:  No gross focal deficits.   Pertinent labs  are pending.   IMPRESSION:  1. Symptomatic bradycardia.  2. Nonischemic cardiomyopathy.  3. History of ventricular tachycardia.  4. History of atrial fibrillation maintaining sinus rhythm since      cardioversion in May 2009.  5. History of alcohol use.  6. Mild coronary disease.  7. Diabetes.  8. Hypertension.  9. Ongoing tobacco abuse.  10.Chronic Coumadin therapy.   PLAN:  Will proceed on with dual-chamber pacemaker implantation.  Patient's Coumadin will be held appropriately.  Further treatment plan  to follow up per Dr. Deborah Chalk discretion.      Sharlee Blew, N.P.      Colleen Can. Deborah Chalk, M.D.  Electronically Signed    LC/MEDQ  D:  08/24/2007  T:  08/25/2007  Job:  [id number]

## 2010-07-27 NOTE — H&P (Signed)
Mark Hess, Mark Hess             ACCOUNT NO.:  [id number]   MEDICAL RECORD NO.:  [id number]          PATIENT TYPE:  INP   LOCATION:  3742                         FACILITY:  [facility]   PHYSICIAN:  Colleen Can. Deborah Chalk, M.D.DATE OF BIRTH:  1923/09/26   DATE OF ADMISSION:  06/25/2007  DATE OF DISCHARGE:  07/05/2007                              HISTORY & PHYSICAL   CHIEF COMPLAINT:  Fatigue, palpitations, and shortness of breath.   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  He was admitted in  April of this year with an episode of substernal fullness in his chest  with shortness of breath and diaphoresis.  He underwent cardiac  catheterization which showed mild coronary disease.  He was noted to  have severe global LV dysfunction.  He was also noted to have a new  onset of atrial fibrillation and this is of uncertain duration.  Initially, plans were made for him to undergo a TEE cardioversion but  there was concern for a suspicious mass in the left atrial appendage and  so the cardioversion was not performed at that time.  He has been  maintained on antiarrhythmic.  Coumadin levels have been therapeutic and  he now presents for attempts of cardioversion with subsequent plans for  repeat 2D echocardiogram.   PAST MEDICAL HISTORY:  1. Lengthy hospitalization in April 2009 for nonischemic      cardiomyopathy.  2. Nonsustained ventricular tachycardia, he does have a shock test in      place.  3. Atrial fibrillation of uncertain duration.  4. left atrial clot.  5. History of alcohol use.  6. Cardiac catheterization on April 14 showing mild coronary disease      with a 50% distal right coronary, 20%-30% scattered irregularities      in the left circumflex with severe global LV dysfunction with      ejection fraction of 15%-20%.  7. Coumadin anticoagulation.  8. Diabetes.  9. Hypertension.  10.Ongoing tobacco  abuse.  11.Positive family history of heart disease.  12.COPD.  13.Questionable claudication.  14.History of cholecystectomy.  15.Anxiety.   ALLERGIES:  None.   CURRENT MEDICATIONS:  1. Lasix 20 mg a day.  2. Coumadin taking 5 mg 3 days a week, 2.5 mg x4.  He was recently      just increased to 5 mg 5 days a week and 2.5 mg x2 as of Jul 26, 2007.  3. Ambien 10 mg at bedtime.  4. Coreg CR 10 mg a day.   FAMILY HISTORY:  Unchanged.   SOCIAL HISTORY:  Unchanged.   REVIEW OF SYSTEMS:  He has been seen back in the office several times  since his discharge from the hospital.  His sleep habits are very poor.  He has been very anxious.  He has had no actual shocks delivered but  does continue to complain of palpitations.  His shortness of breath has  improved.  He is chewing tobacco but not actually smoking.  He has had  no further alcohol  use.   PHYSICAL EXAMINATION:  He is an anxious white male.  His weight 154 pounds, blood pressure is 90/54 sitting, 90/50 standing,  heart rate is 60 and irregular, respirations 18.  He is afebrile.  He is  suntanned.  SKIN:  Warm and dry.  Color is unremarkable.  LUNGS:  Coarse.  CARDIAC:  Shows an irregularly irregular rhythm.  Shock test is in  place.  ABDOMEN:  Soft.  Positive bowel sounds.  EXTREMITIES:  Showed no evidence of edema.  NEUROLOGIC:  Shows no gross focal deficits.   PERTINENT LABORATORY DATA:  BUN 47, creatinine 1.7.  His INR was 2.1.   IMPRESSION:  1. Atrial fibrillation of uncertain duration.  2. Past history of left atrial thrombus.  3. Nonischemic cardiomyopathy with poor ejection fraction of 15%-20%.  4. Runs of nonsustained ventricular tachycardia, currently with shock      test in place.  5. History of multisubstance abuse.  6. Diabetes.   PLAN:  We will proceed on with attempts of cardioversion.  The procedure  has been discussed in full detail and he is willing to proceed on  Wednesday, Aug 01, 2007.      Sharlee Blew, N.P.      Colleen Can. Deborah Chalk, M.D.  Electronically Signed    LC/MEDQ  D:  07/27/2007  T:  07/28/2007  Job:  [id number]

## 2010-07-27 NOTE — Consult Note (Signed)
Mark Hess, Mark Hess             ACCOUNT NO.:  [id number]   MEDICAL RECORD NO.:  [id number]          PATIENT TYPE:  INP   LOCATION:  4734                         FACILITY:  [facility]   PHYSICIAN:  Colleen Can. Deborah Chalk, M.D.DATE OF BIRTH:  10-05-1923   DATE OF CONSULTATION:  06/26/2007  DATE OF DISCHARGE:                                 CONSULTATION   REFERRING PHYSICIAN:  Thora Lance, M.D.   Thank you much for asking me to see Mr. Seibert.  He presented to the  hospital yesterday with an episode of substernal fullness in his chest  with shortness of breath and diaphoresis.  He was noted to be in atrial  fibrillation, but he has had a chronic irregularity of his heart, there  is question as to whether or not that may or may not be new according to  he and his wife.  Once he has been in the hospital, chest pain basically  resolved.  He has not had recurrent chest pain in the hospital.   Over the last few months, he is noticing increasing shortness of breath  and occasional vague chest discomfort, but not as severe as what he has  had.  He does get short of breath if he moves at a quick pace.  He does  have aching in his legs, particularly on the right, if he walks quickly.   His cardiovascular risk factors include recent resumption of cigarette  smoking, diabetes mellitus for 5-10 years, and hypertension under  treatment.  He is not on cholesterol-lowering medicines.  He does not  have a family history of heart disease.   PAST MEDICAL HISTORY:  He is on benazepril and glipizide combination for  diabetes.   PAST SURGICAL HISTORY:  Cholecystectomy.   OTHER HOSPITALIZATIONS:  He remotely had a cardiac catheterization.   FAMILY HISTORY:  One brother died at age 15 of myocardial infarction.  Otherwise, negative for myocardial infarction.  One son recently had an  episode of paroxysmal atrial fibrillation.   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  He  works  on a golf course, in golf course maintenance now, and stays active  at age 77.  He has not had much in the way of headaches currently, but  had those younger in his life.  He does have chronic shortness of  breath.  Otherwise, review of systems is as noted above.   PHYSICAL EXAMINATION:  GENERAL:  He is a well-developed 77 year old  male.  HEENT:  Negative.  LUNGS:  Show somewhat coarse breath sounds.  HEART:  Shows an irregularly irregular rhythm.  ABDOMEN:  Soft, nontender.  Peripheral pulses are present, maybe  slightly diminished.  EXTREMITIES:  There is no lower extremity edema.  VITAL SIGNS:  His blood pressure is 116/70, heart rate 79, respiratory  rate is 20, O2 sats are 95%.   LABORATORY DATA:  His CBC shows hematocrit of 44 and white count of  7000.  His chemistries show sodium of 140, potassium of 5.0, BUN of 22,  creatinine of 1.3, and glucose of 144.  OVERALL IMPRESSION:  1. New-onset chest pressure, rule out myocardial infarction.  2. Atrial fibrillation, possibly new.  3. Diabetes.  4. Hypertension.  5. Ongoing cigarette abuse.  6. Positive family history of heart disease.  7. Chronic obstructive pulmonary disease.  8. Possible claudication.   PLAN:  In light of these symptoms and possibly new onset of atrial  fibrillation, we will proceed on with cardiac catheterization and  coronary arteriograms.  Procedure risks and benefits have been  explained.  Risks including heart attack, stroke, heart stoppage, death,  allergy, air emboli, and bleeding all have been explained, and the  patient is willing to proceed.  We have briefly discussed angioplasty  and stents with risk of bleeding associated with antiplatelet agents.      Colleen Can. Deborah Chalk, M.D.  Electronically Signed     SNT/MEDQ  D:  06/26/2007  T:  06/27/2007  Job:  [id number]   cc:   Thora Lance, M.D.

## 2010-07-27 NOTE — Cardiovascular Report (Signed)
NAMETARYLL, REICHENBERGER             ACCOUNT NO.:  [id number]   MEDICAL RECORD NO.:  [id number]          PATIENT TYPE:  OIB   LOCATION:  2899                         FACILITY:  [facility]   PHYSICIAN:  Colleen Can. Deborah Chalk, M.D.DATE OF BIRTH:  November 27, 1923   DATE OF PROCEDURE:  10/01/2007  DATE OF DISCHARGE:  10/01/2007                            CARDIAC CATHETERIZATION   PROCEDURE:  Cardioversion.   ANESTHESIA:  Dr. Arta Bruce with 150 mg Pentothal IV.   PROCEDURE:  Using anteroposterior patch, the patient was cardioverted  with 120 joules of biphasic energy to normal sinus rhythm.  The pacer  was interrogated.  It was EnRhythm Medtronic Device and after the  procedure, therapy, and antitachycardia, atrial pacing algorithms were  turned on.  The patient tolerated the procedure well.      Colleen Can. Deborah Chalk, M.D.  Electronically Signed     SNT/MEDQ  D:  10/01/2007  T:  10/02/2007  Job:  [id number]

## 2010-07-27 NOTE — Discharge Summary (Signed)
NAMEMAKAR, SLATTER             ACCOUNT NO.:  [id number]   MEDICAL RECORD NO.:  [id number]          PATIENT TYPE:  INP   LOCATION:  3742                         FACILITY:  [facility]   PHYSICIAN:  Thora Lance, M.D.  DATE OF BIRTH:  [birth year]   DATE OF ADMISSION:  06/25/2007  DATE OF DISCHARGE:  07/05/2007                               DISCHARGE SUMMARY   REASON FOR ADMISSION:  An 77 year old white male who presented with 1  day of progressive sensation of worsening shortness of breath, unable to  catch breath, and chest tightness.  This lasted for an hour and a half  and he called EMS and was taken to the ER.   SIGNIFICANT FINDINGS:  VITAL SIGNS:  Blood pressure 142/70, heart rate  97, respirations 24, and oxygen saturation 94%.  LUNGS:  Bilateral crackles at the bases.  No wheezes.  HEART:  Regular.  No murmurs, gallops, or rubs.  ABDOMEN:  Benign.  EXTREMITIES:  No edema.   LABORATORY:  WBC 10.3, hemoglobin 15.7, and platelets 215.  Sodium 141,  potassium 2.9, chloride 109, and creatinine 1.4.  BNP 601.  Troponin  0.05.  INR 1.0.  D-dimer 0.62.  ABG, pH 7.34, pCO2 of 35, pO2 of 53.  CT  scan of the chest was negative for pulmonary embolus, there is  questionable pneumonia, questionable bronchiectasis, with small  bilateral pleural effusions, and cardiomegaly.  EKG, atrial  fibrillation.   HOSPITAL COURSE:  1. Congestive heart failure/possible unstable angina.  The patient was      admitted with some CHF and possible unstable angina.  He was placed      on a beta-blocker, aspirin, and Lovenox.  His cardiac enzymes were      negative.  He was also found to be in atrial fibrillation with rate      controlled on beta-blocker.  He was seen by Dr. Roger Shelter of      the Cardiology service.  His Norvasc was discontinued.  He was      placed on heparin IV.  Lopressor was changed to Coreg.  The patient      had cardiac catheterization, which showed an EF of 10 to 20%.  He  had nonobstructive coronary artery disease.  He was started on      Coumadin.  He was gently diuresed with Lasix.  TEE-guided      cardioversion was considered.  A transesophageal echocardiogram was      done on July 02, 2007, which showed moderate LV dysfunction with      an EF of 35-40%, mild-to-moderate left atrial enlargement, and      findings consistent with thrombosis in the left atrium.  Because of      the thrombosis, a cardioversion was not pursued.  Anticoagulation      for 3-4 weeks with an outpatient cardioversion was recommended.      The patient was seen by Dr. Graciela Husbands at [facility] for consideration      of AICD.  A life fast was obtained for the patient prior to      discharge.  The patient was therapeutically anticoagulated by      discharge on July 05, 2007, with an INR of 2.3.  The patient's      last BNP was 656.  2. Diabetes.  His glimepiride was stopped.  His blood sugars remained      under very good control.  We will leave him off medications as an      outpatient.  3. Gout.  The patient did develop an episode of gout involving the      right ankle and was placed on steroids and colchicine with      improvement by discharge.   DISCHARGE DIAGNOSES:  1. Congestive heart failure.  2. Dilated cardiomyopathy.  3. Atrial fibrillation.  4. Diabetes mellitus.  5. Gout.  6. Hypertension.  7. Hyperlipidemia.   PROCEDURES:  1. Cardiac catheterization.  2. Transesophageal echocardiogram.   DISCHARGE MEDICATIONS:  1. Benazepril 20 mg daily.  2. Carvedilol 3.125 mg t.i.d.  3. Furosemide 20 mg daily.  4. Warfarin 5 mg q.p.m.  5. Prednisone 5 mg b.i.d. for 1 day, 5 mg once a day for 2 days, and      then discontinue.  6. Glimepiride and amlodipine were stopped.   DISPOSITION:  Discharged to home.   FOLLOWUP:  Dr. Valentina Lucks in 1 week.  He will have his INR checked on July 09, 2007, in our office.  Dr. Deborah Chalk in 10 days.   DIET:  Low-sodium diabetic diet.    ACTIVITY:  As tolerated.           ______________________________  Thora Lance, M.D.     JJG/MEDQ  D:  08/17/2007  T:  08/17/2007  Job:  [id number]   cc:   Colleen Can. Deborah Chalk, M.D.

## 2010-07-27 NOTE — Procedures (Signed)
DUPLEX DEEP VENOUS EXAM - LOWER EXTREMITY   INDICATION:  Right lower extremity pain and swelling.   HISTORY:  Edema:  Right lower extremity swelling.  Trauma/Surgery:  No.  Pain:  Right lower extremity calf pain.  PE:  No.  Previous DVT:  No.  Anticoagulants:  No.  Other:   DUPLEX EXAM:                CFV   SFV   PopV  PTV    GSV                R  L  R  L  R  L  R   L  R  L  Thrombosis    o  o  o  o  o  o  o   o  o  o  Spontaneous   +  +  +  +  +  +  +   +  +  +  Phasic        +  +  +  +  +  +  +   +  +  +  Augmentation  +  +  +  +  +  +  +   +  +  +  Compressible  +  +  +  +  +  +  +   +  +  +  Competent     +  +  +  +  +  +  +   +  +  +   Legend:  + - yes  o - no  p - partial  D - decreased   IMPRESSION:  No evidence of deep venous thrombosis or superficial venous  thrombosis in the bilateral lower extremities.    _____________________________  Quita Skye Hart [place], M.D.   AS/MEDQ  D:  12/21/2007  T:  12/21/2007  Job:  [id number]

## 2010-07-27 NOTE — H&P (Signed)
Mark Hess, Mark Hess             ACCOUNT NO.:  [id number]   MEDICAL RECORD NO.:  [id number]          PATIENT TYPE:  INP   LOCATION:  1828                         FACILITY:  [facility]   PHYSICIAN:  Michiel Cowboy, MDDATE OF BIRTH:  06/03/1923   DATE OF ADMISSION:  06/25/2007  DATE OF DISCHARGE:                              HISTORY & PHYSICAL   PRIMARY CARE Aunesti Pellegrino:  Thora Lance, MD   CHIEF COMPLAINT:  Shortness of breath.   This is an 77 year old gentleman with history of tobacco abuse and  hypertension, who presented to the emergency department with acute onset  chest tightness and shortness of breath.   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  Then thereafter started to have some vague  feeling of being unwell.  He laid down to sleep and then later on that  day developed progressively worsening sensation of shortness of breath,  unable to catch his breath, chest tightness, feeling like he is not able  to take a deep breath.  This has lasted for about an hour and a half.  He has called EMS.  When EMS arrived first he was given nebulizer  treatment, but he felt that this was not helping at all, then he was  given nitroglycerin, morphine, and I believe a dose of Lasix, after  which he felt better.  Initial EMS report, the patient was crackling at  beginning.  When he presented to the emergency department he was first  put on CPAP. He was given Solu-Medrol.  The patient started to do better  and at the time of my evaluation is breathing very comfortably on 3 L of  nasal cannula.  He had no respiratory distress.  CT scan of the chest  was done to rule out PE and was negative for PE.  Did show some  bronchiectasis and possible COPD changes, as well as cardiomegaly and a  small pleural effusion bilaterally.  [facility] hospitalist called to admit  the patient.   REVIEW OF SYSTEMS:  Negative for fever, chills.  No vomiting.   No  nausea, and no diarrhea. Chest discomfort as per above.  No wheezing.  To also endorses similar symptoms, but milder quality in the past, most  associated with any activity.   PAST MEDICAL HISTORY:  Significant for hypertension and diabetes, diet  controlled.   ALLERGIES:  NO KNOWN DRUG ALLERGIES.   MEDICATIONS:  1. Amlodipine 10 mg twice daily.  2. Benazepril 20 mg p.o. once a day.  3. Glimepiride 2 mg once a day.  4. Colace as needed for constipation.   SOCIAL HISTORY:  The patient has a 50 pack year smoking history.  Denies  drug abuse.  Does drink, but occasionally.  Lives with his wife, at  home.   He is a very active gentleman who does all activity if daily living by  himself.   FAMILY HISTORY:  Noncontributory.   PHYSICAL EXAMINATION:  VITAL SIGNS:  Blood pressure 142/70.  Pulse 97.  Respirations 24.  Satting 94%.  Temperature 97.0.  GENERAL:  The patient is in no acute distress, sitting comfortably on a  stretcher.  Moist mucous membranes.  HEART:  Irregular.  No murmurs, rubs, or gallops.  LUNGS:  Crackles bilaterally at the bases.  No wheezes noted.  ABDOMEN:  Soft, nontender, nondistended.  NEUROLOGIC:  Neurologically intact.  LOWER EXTREMITIES:  Without edema.   LABORATORY DATA:  White blood cell count 10.3.  Hemoglobin 15.7.  Platelets 215.  Eosinophils 5%.  Sodium 141, potassium 3.9, creatinine  1.4.  LFTs within normal limits.  BNP elevated at 601.  Troponin 0.05.  Cardiac enzymes otherwise negative.  INR 1.0.  D-dimer 0.62.  ABG 7.343,  pCO2 35.2, pO2 of 53.   CT of the chest negative for PE, questionable pneumonia.  Possible early  aspiration pneumonia.  Questionable bronchiectasis.  Small lateral  pleural effusions.  Cardiomegaly and a small apical nodule 4 mm.   EKG, poor quality.  Questionable whether this is atrial fibrillation  versus frequent PACs, PVCs as well.  Irregular occasional P-waves are  noted.   ASSESSMENT ON LIMB MOVEMENT:  This  is an 77 year old gentleman with  history of hypertension, diabetes with acute onset of chest pain and  shortness of breath.   1. Chest pain, shortness of breath.  This is more of a chest pressure.      The patient endorses similar, but lessened severity symptoms with      exertion.  Differential includes acute coronary syndrome versus      flash pulmonary edema versus new atrial fibrillation versus chronic      obstructive pulmonary disease exacerbation, although the patient      has no known history of chronic obstructive pulmonary disease.  He      does have an extensive history of smoking.  Will admit to telemetry      for rule out for my serial cardiac enzymes, repeat      electrocardiogram.  Would recommend official cardiology evaluation      in the morning.  The patient would probably benefit from stress      test to evaluate for coronary artery disease, especially given at      baseline he is a fairly healthy gentleman.  So he is a fairly      active gentleman with known risk factors such as hypertension and      diabetes, as well as tobacco abuse.  2. Given good response to Lasix, elevated BNP, and cardiomegaly, will      obtain a two-dimensional echocardiogram of the heart and repeat BNP      in the morning.  Hold off on giving Lasix right now since chest x-      ray right now is not consistent with acute pulmonary edema, but      will watch for possible recurrence of shortness of breath and if      needed we will repeat chest x-ray and see if needs Lasix in the      future.  Elevated BNP could be because of chronic obstructive      pulmonary disease versus congestive heart failure at this point      unclear, until obtains further studies.  CT of the chest did show      bronchiectasis with chronic obstructive pulmonary disease.  Given      extensive smoking history, will do albuterol and Atrovent nebs.      The patient is currently not wheezing, hold off on steroids right  now, given that the patient is diabetic and see if this is      something that he will need in the future.  3. Questionable pneumonia.  Will repeat chest x-ray in the morning and      also cover with Avelox.  4. Slightly elevated creatinine.  Unsure if this is chronic versus      acute.  Will repeat in the morning and follow.  5. Small pulmonary nodule.  The patient will need repeat CT scan in      about 6-12 months.  6. Prophylaxis, Lovenox and Protonix.  7. Abnormal electrocardiogram, question atrial fibrillation.  Will      obtain rhythm strip.  Would recommend further evaluation by      cardiology to see if this is true atrial fibrillation.  If so, the      patient will need to be considered for Coumadin, which will defer      to his primary are Conya Ellinwood.  He will at least need to be on full      dose of aspirin.      The patient also will obtain two-dimensional echocardiogram for      further evaluation of other abnormalities, structure abnormalities.  8. Also regarding tobacco abuse, have recommended cessation of tobacco      use.      Michiel Cowboy, MD  Electronically Signed     AVD/MEDQ  D:  06/25/2007  T:  06/26/2007  Job:  [id number]   cc:   Thora Lance, M.D.

## 2010-07-27 NOTE — H&P (Signed)
NAMECOYT, GOVONI             ACCOUNT NO.:  [id number]   MEDICAL RECORD NO.:  [id number]          PATIENT TYPE:  OIB   LOCATION:  6532                         FACILITY:  [facility]   PHYSICIAN:  Colleen Can. Deborah Chalk, M.D.DATE OF BIRTH:  1924/01/07   DATE OF ADMISSION:  DATE OF DISCHARGE:                              HISTORY & PHYSICAL   CHIEF COMPLAINT:  Dizziness in the setting of recurrent atrial  fibrillation.   HISTORY OF PRESENT ILLNESS:  Mr. Accardo is a very pleasant 77 year old  white male who has had recurrent atrial fibrillation.  He has had known  LV dysfunction and underwent original cardioversion on May 20, however,  following that he had significant bradycardia.  He subsequently had a  pacemaker implanted approximately 1 month ago.  During that procedure,  he reverted back to atrial fibrillation.  He has been placed on  amiodarone and he now presents for attempts at repeat cardioversion.  Clinically, he continues to have episodes of dizziness.  He had good  days and bad days.  He has had no chest pain or shortness of breath.   PAST MEDICAL HISTORY:  1. Atrial fibrillation, currently on amiodarone.  He did have a      previous cardioversion on Aug 01, 2007 with short-lived results.  2. Nonischemic cardiomyopathy.  He had catheterization in April 2009      showing mild coronary disease with severe global dysfunction.      Ejection fraction, however, now is currently 30%-40%.  3. History of nonsustained ventricular tachycardia.  4. Dual-chamber pacemaker implantation in June 2009.  5. History of alcohol abuse.  6. Chronic Coumadin therapy.  7. Diabetes.  8. Hypertension.  9. Tobacco abuse.  10.COPD.  11.GERD.   ALLERGIES:  None.   CURRENT MEDICATIONS:  1. Benazepril 20 mg half a tablet daily.  2. Lasix 20 mg every other day.  3. Coumadin, currently taking 5 mg 4 days a week, two-and-half x3.  4. Ambien 10 mg at bedtime.  5. Xanax p.r.n.  6. Amiodarone 200 mg a  day.  7. Colchicine p.r.n. gout.  8. Kapidex 60 mg a day.   FAMILY HISTORY:  Unchanged.   SOCIAL HISTORY:  Unchanged.   REVIEW OF SYSTEMS:  As noted above and is otherwise negative.   PHYSICAL EXAMINATION:  GENERAL:  He is a pleasant, he is somewhat  anxious, and he is in no acute distress.  VITAL SIGNS:  Weight is 147 pounds, blood pressure is 128/78 sitting,  120/76 standing, heart rate 76 and irregular, respirations 18, and he is  afebrile.  SKIN:  Warm and dry.  Color is ruddy and suntanned.  HEENT:  Unremarkable.  CARDIAC:  Irregularly irregular rhythm.  Pacemaker is in place in the  right upper chest.  LUNGS:  Basically clear.  ABDOMEN:  Soft, positive bowel sounds, nontender.  EXTREMITIES:  Without edema.  NEUROLOGIC:  No gross focal deficits.   PERTINENT LABORATORY DATA:  Pending.   OVERALL IMPRESSION:  1. Recurrent atrial fibrillation.  2. Functioning dual-chamber pacemaker.  3. Nonischemic cardiomyopathy.  4. History of nonsustained ventricular tachycardia.   PLAN:  We will proceed on with attempts at cardioversion.  The procedure  has been reviewed in full detail and he is willing to proceed on Monday,  October 01, 2007.      Sharlee Blew, N.P.      Colleen Can. Deborah Chalk, M.D.  Electronically Signed    LC/MEDQ  D:  09/25/2007  T:  09/26/2007  Job:  [id number]

## 2010-07-27 NOTE — Discharge Summary (Signed)
Mark Hess, Mark Hess             ACCOUNT NO.:  [id number]   MEDICAL RECORD NO.:  [id number]          PATIENT TYPE:  OIB   LOCATION:  6532                         FACILITY:  [facility]   PHYSICIAN:  Colleen Can. Deborah Chalk, M.D.DATE OF BIRTH:  05/31/1923   DATE OF ADMISSION:  08/29/2007  DATE OF DISCHARGE:  08/31/2007                               DISCHARGE SUMMARY   DISCHARGE DIAGNOSES:  1. Bradycardia with subsequent permanent transvenous pacemaker      implantation with EnRhythm  [id number] DR serial number PNP [id number].  2. Recurrent atrial fibrillation, now loading with amiodarone.  3. Nonischemic cardiomyopathy.  He has had previous cardiac      catheterization in April 2009 showing mild coronary disease with      severe global dysfunction, ejection fraction is now currently 30-      40%.  4. History of nonsustained ventricular tachycardia.  5. Previous cardioversion for atrial fibrillation in May 2009.  6. History of alcohol abuse.  7. Coumadin anticoagulation.  8. Diabetes.  9. Hypertension.  10.Ongoing tobacco abuse.  11.Chronic obstructive pulmonary disease.   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  He has had LV dysfunction as well as atrial fibrillation and underwent a  successful cardioversion on May 20; however, since that time he has  continued to have complaints of palpitations with documented PVCs, PACs  as well as significant complaints of dizziness that has been associated  with decrease in blood pressure.  His medicines have been very difficult  to titrate in light of his bradycardia.  He subsequent presents for  pacemaker implantation.   Please see the history and physical for further patient presentation and  profile.   LABORATORY DATA:  His CBC is normal.  His chemistry showed a BUN of 37,  creatinine was 1.6, potassium was 5.7, calcium was 10.9, and glucose was  121.  INR dropped down to 1.2 after Coumadin was held appropriately.   HOSPITAL COURSE:  The patient was admitted electively in order to  undergo a dual chamber pacemaker implantation.  The procedure was  tolerated well without any known complications.  A Medtronic EnRhythm  [id number] DR serial number PNP [id number] was implanted.  Postprocedure, he was  transferred to 6500, amiodarone was initiated.  He was watched on the  monitor and by 1609, he is doing well without complaints.  His Coumadin  has been reinitiated at a lower dose.  His overall physical exam is  unremarkable.  He is felt to be a satisfactory candidate for discharge  today.   Discharge condition is stable.   DISCHARGE MEDICINES:  Amiodarone to 200 mg b.i.d., Lasix 20 mg every  other day, Lotensin 10 mg a day, Ambien and Xanax as needed as he was  taking before, Coumadin was decreased to 2-1/2 mg (1/2 tablet daily),  and he is to use Tylenol for discomfort.   Specific instructions are given regarding pacemaker care, specifically  not to raise the right arm above his head for the next 2 weeks.  He is  also to paint the wound 2 times a day with Betadine swabs.   We will plan on seeing him back in the office for a followup visit in 1  week.  He is asked to call us at that time.  Also, we will check the  protime next Tuesday in our office sometimes at 8:00 a.m. and 4:00 p.m.  He is to call if any problems arise in the interim.      Sharlee Blew, N.P.      Colleen Can. Deborah Chalk, M.D.  Electronically Signed    LC/MEDQ  D:  08/31/2007  T:  08/31/2007  Job:  [id number]

## 2010-07-27 NOTE — Cardiovascular Report (Signed)
Mark, Hess             ACCOUNT NO.:  [id number]   MEDICAL RECORD NO.:  [id number]          PATIENT TYPE:  INP   LOCATION:  4734                         FACILITY:  [facility]   PHYSICIAN:  Colleen Can. Deborah Chalk, M.D.DATE OF BIRTH:  [birth year]   DATE OF PROCEDURE:  06/26/2007  DATE OF DISCHARGE:                            CARDIAC CATHETERIZATION   PROCEDURE:  1. Left heart catheterization with selective coronary angiography.  2. Left ventricular angiography.  3. Percutaneous right femoral artery with Angio-Seal.   CATHETERS:  1. 6-French 4 curved Judkins right and left coronary catheter.  2. 6-French pigtail ventriculographic catheter.   CONTRAST:  Omnipaque.   MEDICATIONS GIVEN PRIOR TO PROCEDURE:  Valium 10 mg p.o.   MEDICATIONS GIVEN DURING PROCEDURE:  None.   COMMENTS:  The patient tolerated the procedure well.   HEMODYNAMIC DATA:  The aortic pressure was 92/55, LV was 91/14-20.  There was no aortic valve gradient noted on pullback.   ANGIOGRAPHIC DATA:  Left ventricular angiogram was performed in RAO  position.  There was a global hypokinesis and the global ejection  fraction was estimated to be at 15% to 20%.   CORONARY ARTERIES:  Coronary arteries arise and distribute normally.  1. Right coronary artery is a moderate sized dominant vessel.  There      is 50% narrowing before the carotids and irregularities otherwise.      There is no significant obstructive disease.  2. Left main coronary artery is normal.  3. Left anterior descending in the long vessel that crosses the apex.      There are scattered irregularities.  4. Left circumflex had irregularities.  It is a large bifurcating      obtuse marginal vessel as well as a large continuation branch in      the AV groove.  There is no significant obstructive disease      present.   OVERALL IMPRESSION:  1. Mild coronary atherosclerosis with 50% distal right coronary artery      and 20% to 30% scattered plaque in  the left circumflex.  2. There is a severe global left ventricular dysfunction.   PLAN:  Mark Hess will be managed for his atrial fibrillation and left  ventricular dysfunction.  We will consider Coumadin anticoagulation.  In  light of the left ventricular dysfunction, cardioversion is probably  best delayed for 3-4 weeks as we address other causes of the left  ventricular dysfunction.  This was felt to be a nonischemic weakness of  the myocardium.      Colleen Can. Deborah Chalk, M.D.  Electronically Signed     SNT/MEDQ  D:  06/26/2007  T:  06/27/2007  Job:  [id number]   cc:   Thora Lance, M.D.

## 2010-07-27 NOTE — Consult Note (Signed)
NAMECALI, CUARTAS             ACCOUNT NO.:  [id number]   MEDICAL RECORD NO.:  [id number]          PATIENT TYPE:  INP   LOCATION:  3742                         FACILITY:  [facility]   PHYSICIAN:  Duke Salvia, MD, FACCDATE OF BIRTH:  1923/03/28   DATE OF CONSULTATION:  07/03/2007  DATE OF DISCHARGE:                                 CONSULTATION   Thank you very much for asking [place] to see Mr. Job Holtsclaw in the  electrophysiological consultation for nonischemic cardiomyopathy and  risk stratification.   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  He was found to have significant depression  of left ventricular systolic function with an echo demonstrating EF of  about 20%, left ventricular wall thickness was normal, and left atrial  size was only minimally elevated at 42.  There was moderate MR.  He  subsequently underwent catheterization, demonstrating nonobstructive  disease with an LVEDP of about 20 and ejection fraction again recorded  as severe.   He underwent TEE yesterday with anticipation of cardioversion, he was  found to have left atrial clot and left ventricular spontaneous  contrast, ejection fraction was felt to be a little bit better, and DC  cardioversion was deferred.  I should also note that the patient had  recurrent congestive heart failure the night of this catheterization  attributed to fluid overload.   Prior to hospitalization, the patient had no significant limitations in  activity.  He denied nocturnal dyspnea, orthopnea, or peripheral edema.  He had had no palpitations and he had had no awareness of his atrial  fibrillation and no antecedent syncope.   His thromboembolic risk factors were notable for hypertension, diabetes,  and age.  He denies symptoms of a prior stroke.   PAST MEDICAL HISTORY:  In addition to the above is notable  for:  1. Nephrolithiasis.  2. Gout.   PAST SURGICAL HISTORY:  Notable for cholecystectomy and appendectomy  (possibly tonsillectomy).   FAMILY HISTORY:  Noncontributory.   CURRENT MEDICATIONS:  Include:  1. Aspirin 325.  2. Lotensin 20.  3. Protonix 40.  4. Coumadin.  5. Furosemide 40.  6. Carvedilol 3.125 t.i.d.  7. Colchicine 0.6.  8. Prednisone 10 b.i.d.   ALLERGIES:  He has no known drug allergies.   PHYSICAL EXAMINATION:  GENERAL:  He is an elderly Caucasian male in no  acute distress, appearing his stated age of 77.  VITAL SIGNS:  His respirations are 16 and unlabored, his blood pressure  110/77, his pulse is 69 and irregular, his O2 saturation is 95%.  He is  in no acute distress.  HEENT:  Exam demonstrated no icterus and no xanthoma.  NECK:  The  neck veins were 9-10 cm.  His carotids were brisk and full  bilaterally without bruits.  BACK:  Without kyphosis or scoliosis.  LUNGS:  Had an occasional end-expiratory wheeze.  HEART:  Heart sounds were irregular with a 2/6 murmur heard at the apex.  ABDOMEN:  Soft with  active bowel sounds without midline pulsation.  EXTREMITIES:  Femoral pulses were 2+.  Distal pulses were surprisingly  good.  There was no clubbing, cyanosis or edema.  NEUROLOGICAL:  Exam was grossly normal.  SKIN:  Warm and dry.   LABORATORY DATA:  Electrocardiogram dated June 27, 2007, demonstrated  atrial fibrillation at 25 with intervals of  __________/0.08/0.41.   IMPRESSION:  1. Nonischemic cardiomyopathy with an ejection fraction of about 20%      with some discordance between measurements.  2. Atrial fibrillation with controlled ventricular response, question,      cause or effect versus nonischemic cardiomyopathy.  3. Thromboembolic risk factor is notable for:      a.     Hypertension.      b.     Diabetes.      c.     Coronary artery disease.      d.     Congestive heart failure.  4. Recurrent congestive failure related to probably  volume overload.   DISCUSSION:  Mr. Puccinelli has nonischemic cardiomyopathy of unknown  duration in the setting of atrial fibrillation.  It is not clear how the  atrial fibrillation is related to his symptoms.  He was surprisingly  asymptomatic prior to admission, but surprisingly fragile while in the  hospital with borderline blood pressures.   As related to sudden cardiac death risk prevention, we will need to see  what happens to his ejection fraction over the next number of months to  see whether he will recover on medical therapy.  In the interim, a  LifeVest could be utilized given his significant depression of LV  systolic function as potential protection against arrhythmic death.   As related to his atrial fibrillation, I will defer this to Dr. Deborah Chalk,  but the plan at this point is to pursue a repeat attempt at  cardioversion in a number of weeks and we will see how that turns out.  In the absence of symptoms and a  not clear relationship, in my mind that the atrial fibrillation was the  cause of the cardiomyopathy, a strategy of rate control might be  appropriate in the event that sinus rhythm is not able to be maintained.   Thank you very much for the consultation and we will follow with you.      Duke Salvia, MD, [facility]  Electronically Signed     SCK/MEDQ  D:  07/03/2007  T:  07/03/2007  Job:  [id number]   cc:   Thora Lance, M.D.  Colleen Can. Deborah Chalk, M.D.

## 2010-07-27 NOTE — Cardiovascular Report (Signed)
Mark Hess, Mark Hess             ACCOUNT NO.:  [id number]   MEDICAL RECORD NO.:  [id number]          PATIENT TYPE:  OIB   LOCATION:  2899                         FACILITY:  [facility]   PHYSICIAN:  Colleen Can. Deborah Chalk, M.D.DATE OF BIRTH:  09-08-1923   DATE OF PROCEDURE:  08/01/2007  DATE OF DISCHARGE:                            CARDIAC CATHETERIZATION   PROCEDURE:  Cardioversion.   ANESTHESIA:  125 mg of IV Pentothal by Dr. Jairo Ben.   PROCEDURE:  Using antero-posterior pads, the patient was cardioverted  with 121 seconds of biphasic energy to a normal sinus rhythm.  Overall,  the patient tolerated the procedure well.      Colleen Can. Deborah Chalk, M.D.  Electronically Signed     SNT/MEDQ  D:  08/01/2007  T:  08/02/2007  Job:  [id number]   cc:   Thora Lance, M.D.

## 2010-08-10 ENCOUNTER — Encounter: Payer: Self-pay | Admitting: Nurse Practitioner

## 2010-08-11 ENCOUNTER — Ambulatory Visit (INDEPENDENT_AMBULATORY_CARE_PROVIDER_SITE_OTHER): Payer: Medicare Other | Admitting: Nurse Practitioner

## 2010-08-11 ENCOUNTER — Encounter: Payer: Self-pay | Admitting: Nurse Practitioner

## 2010-08-11 VITALS — BP 122/72 | HR 72 | Ht 69.0 in | Wt 147.0 lb

## 2010-08-11 DIAGNOSIS — I10 Essential (primary) hypertension: Secondary | ICD-10-CM

## 2010-08-11 DIAGNOSIS — I5022 Chronic systolic (congestive) heart failure: Secondary | ICD-10-CM

## 2010-08-11 DIAGNOSIS — Z79899 Other long term (current) drug therapy: Secondary | ICD-10-CM

## 2010-08-11 DIAGNOSIS — I4891 Unspecified atrial fibrillation: Secondary | ICD-10-CM

## 2010-08-11 DIAGNOSIS — I472 Ventricular tachycardia, unspecified: Secondary | ICD-10-CM

## 2010-08-11 LAB — CBC WITH DIFFERENTIAL/PLATELET
Basophils Absolute: 0 10*3/uL (ref 0.0–0.1)
Basophils Relative: 0.6 % (ref 0.0–3.0)
Eosinophils Absolute: 0.2 10*3/uL (ref 0.0–0.7)
Eosinophils Relative: 3.2 % (ref 0.0–5.0)
HCT: 41.2 % (ref 39.0–52.0)
Hemoglobin: 13.5 g/dL (ref 13.0–17.0)
Lymphocytes Relative: 17.3 % (ref 12.0–46.0)
Lymphs Abs: 1.2 10*3/uL (ref 0.7–4.0)
MCHC: 32.8 g/dL (ref 30.0–36.0)
MCV: 89.9 fl (ref 78.0–100.0)
Monocytes Absolute: 0.7 10*3/uL (ref 0.1–1.0)
Monocytes Relative: 9.1 % (ref 3.0–12.0)
Neutro Abs: 5 10*3/uL (ref 1.4–7.7)
Neutrophils Relative %: 69.8 % (ref 43.0–77.0)
Platelets: 185 10*3/uL (ref 150.0–400.0)
RBC: 4.58 Mil/uL (ref 4.22–5.81)
RDW: 16.4 % — ABNORMAL HIGH (ref 11.5–14.6)
WBC: 7.2 10*3/uL (ref 4.5–10.5)

## 2010-08-11 LAB — BASIC METABOLIC PANEL
BUN: 34 mg/dL — ABNORMAL HIGH (ref 6–23)
CO2: 20 mEq/L (ref 19–32)
Calcium: 9.4 mg/dL (ref 8.4–10.5)
Chloride: 112 mEq/L (ref 96–112)
Creatinine, Ser: 2.2 mg/dL — ABNORMAL HIGH (ref 0.4–1.5)
GFR: 30.43 mL/min — ABNORMAL LOW (ref >60.00)
Glucose, Bld: 113 mg/dL — ABNORMAL HIGH (ref 70–99)
Potassium: 4.5 mEq/L (ref 3.5–5.1)
Sodium: 139 mEq/L (ref 135–145)

## 2010-08-11 LAB — LIPID PANEL
Cholesterol: 130 mg/dL (ref 0–200)
HDL: 49.2 mg/dL (ref >39.00)
LDL Cholesterol: 66 mg/dL (ref 0–99)
Total CHOL/HDL Ratio: 3
Triglycerides: 72 mg/dL (ref 0.0–149.0)
VLDL: 14.4 mg/dL (ref 0.0–40.0)

## 2010-08-11 LAB — HEPATIC FUNCTION PANEL
ALT: 14 U/L (ref 0–53)
AST: 20 U/L (ref 0–37)
Albumin: 3.7 g/dL (ref 3.5–5.2)
Alkaline Phosphatase: 75 U/L (ref 39–117)
Bilirubin, Direct: 0.1 mg/dL (ref 0.0–0.3)
Total Bilirubin: 0.3 mg/dL (ref 0.3–1.2)
Total Protein: 6.6 g/dL (ref 6.0–8.3)

## 2010-08-11 LAB — TSH: TSH: 1.28 u[IU]/mL (ref 0.35–5.50)

## 2010-08-11 NOTE — Assessment & Plan Note (Signed)
Blood pressure looks good. We will continue with his current regimen.

## 2010-08-11 NOTE — Assessment & Plan Note (Signed)
Follow up labs are checked today. He was cautioned once again about his sun exposure.

## 2010-08-11 NOTE — Assessment & Plan Note (Signed)
Seems to be in sinus by physical exam. Pacemaker is in place. He is on chronic amiodarone.

## 2010-08-11 NOTE — Patient Instructions (Addendum)
Stay on your current medicines. We are going to check your labs today. Remember to wear your hat and sunscreen! I will have you see Dr. Johney Frame in about 6 months.

## 2010-08-11 NOTE — Assessment & Plan Note (Addendum)
Seems compensated. We will keep him on his current regimen. He is just on low dose ACE. We will have him follow up with Dr. Johney Frame in 6 months since Dr. Deborah Chalk is retiring.

## 2010-08-11 NOTE — Progress Notes (Signed)
Mark Hess Date of Birth: [birth year]   History of Present Illness: Mark Hess is seen today for a follow up visit. He is seen for Dr. Deborah Chalk. He has not been evaluated since January of 2011. He has been doing ok. He is limited by some back pain. From a cardiac standpoint, he has been doing ok. No chest pain or shortness of breath. He is a little more fatigued but he attributes that to aging. He is chronically dizzy. He has had his pacemaker checked by Dr. Johney Frame. He is tolerating his medicines. He tries to wear his hat but has had significant sun exposure.   Current Outpatient Prescriptions on File Prior to Visit  Medication Sig Dispense Refill  . allopurinol (ZYLOPRIM) 300 MG tablet Take 300 mg by mouth daily. 1/2 tablet daily      . amiodarone (PACERONE) 200 MG tablet Take 200 mg by mouth daily.        Marland Kitchen aspirin 81 MG tablet Take 81 mg by mouth daily.        . benazepril (LOTENSIN) 10 MG tablet Take 10 mg by mouth daily.        . meclizine (ANTIVERT) 25 MG tablet Take 25 mg by mouth 3 (three) times daily as needed.        . meclizine (ANTIVERT) 25 MG tablet Take 1 tablet (25 mg total) by mouth 3 (three) times daily as needed.  90 tablet  0  . DISCONTD: aspirin 325 MG tablet Take 325 mg by mouth daily.        Marland Kitchen DISCONTD: colchicine 0.6 MG tablet Take 0.6 mg by mouth daily as needed.        Marland Kitchen DISCONTD: furosemide (LASIX) 20 MG tablet Take 20 mg by mouth daily.          No Known Allergies  Past Medical History  Diagnosis Date  . CHF (congestive heart failure)     EF 30 to 35% per echo in Jan 2011  . Coronary artery disease     Mild CAD per cath in 2009  . Atrial fibrillation     maintained in sinus on amiodarone  . VT (ventricular tachycardia)     History of NSVT; No ICD in place  . Alcohol abuse   . Diabetes mellitus   . Hypertension   . Tobacco abuse   . COPD (chronic obstructive pulmonary disease)   . GERD (gastroesophageal reflux disease)   . Gout   . Dizziness    Chronic    Past Surgical History  Procedure Date  . Transthoracic echocardiogram 03/2009    SHOWED EF IN THE 30 to 35% RANGE  . Cardiac catheterization 06/26/2007    THERE WAS GLOBAL HYPOKINESUS AND EF 15-20%. SHOWED MILD CORONARY DISEASE WITH A 50% DISTAL RIGHT CORONARY IN 20-30% SCATTERED NARROWINGS IN THE LEFT CIRCUMFLEX. HE DID HAVE GLOBAL LV DYSFUNCTION AT THAT TIME  . Insert / replace / remove pacemaker 08/2007  . Cholecystectomy   . Cardioversion   . Cardiovascular stress test 03/2009    History  Smoking status  . Former Smoker  . Quit date: 05/13/2007  Smokeless tobacco  . Current User  . Types: Chew    History  Alcohol Use No    Family History  Problem Relation Age of Onset  . Heart attack Brother   . Atrial fibrillation Son   . Arrhythmia Son     Review of Systems: The review of systems is positive for back pain. He is  chronically dizzy. No chest pain or shortness of breath.  Gait is a little unsteady. He has had some falls since he was last here, but no signficant injury sustained. All other systems were reviewed and are negative.  Physical Exam: BP 122/72  Pulse 72  Ht 5\' 9"  (1.753 m)  Wt 147 lb (66.679 kg)  BMI 21.71 kg/m2 Patient is very pleasant and in no acute distress. Skin is warm and dry. Color is normal except his face is extremely ruddy.  HEENT is unremarkable. Normocephalic/atraumatic. PERRL. Sclera are nonicteric. Neck is supple. No masses. No JVD. Lungs are clear. Cardiac exam shows a regular rate and rhythm. Abdomen is soft. Extremities are without edema. Gait and ROM are intact. No gross neurologic deficits noted.  LABORATORY DATA: Pending   Assessment / Plan:

## 2010-08-11 NOTE — Assessment & Plan Note (Signed)
No ICD in place. He remains on chronic amiodarone.

## 2010-08-13 ENCOUNTER — Telehealth: Payer: Self-pay | Admitting: *Deleted

## 2010-08-13 NOTE — Telephone Encounter (Signed)
Pt notified of lab results and to continue same medications.  Pt will have labs repeated in six months with Dr. Johney Frame.

## 2010-08-13 NOTE — Telephone Encounter (Signed)
Message copied by Adolphus Birchwood on Fri Aug 13, 2010 11:40 AM ------      Message from: Rosalio Macadamia      Created: Wed Aug 11, 2010  3:30 PM       Ok to report. Labs are satisfactory. Stay on same medicines. Recheck in 6 months.

## 2010-09-14 ENCOUNTER — Other Ambulatory Visit: Payer: Self-pay | Admitting: Cardiology

## 2010-09-14 MED ORDER — MECLIZINE HCL 25 MG PO TABS: 25.0000 mg | ORAL_TABLET | Freq: Three times a day (TID) | ORAL | Status: DC | PRN

## 2010-09-14 NOTE — Telephone Encounter (Signed)
Med refill

## 2010-10-14 ENCOUNTER — Encounter: Payer: Medicare Other | Admitting: *Deleted

## 2010-10-19 ENCOUNTER — Encounter: Payer: Self-pay | Admitting: *Deleted

## 2010-10-25 ENCOUNTER — Ambulatory Visit (INDEPENDENT_AMBULATORY_CARE_PROVIDER_SITE_OTHER): Payer: Medicare Other | Admitting: *Deleted

## 2010-10-25 ENCOUNTER — Other Ambulatory Visit: Payer: Self-pay

## 2010-10-25 DIAGNOSIS — I495 Sick sinus syndrome: Secondary | ICD-10-CM

## 2010-10-25 DIAGNOSIS — I4891 Unspecified atrial fibrillation: Secondary | ICD-10-CM

## 2010-10-25 LAB — REMOTE PACEMAKER DEVICE
AL IMPEDENCE PM: 448 Ohm
BATTERY VOLTAGE: 2.99 V
RV LEAD AMPLITUDE: 8.672 mv
VENTRICULAR PACING PM: 4.75

## 2010-11-01 NOTE — Progress Notes (Signed)
Pacer remote check  

## 2010-11-02 ENCOUNTER — Encounter: Payer: Self-pay | Admitting: *Deleted

## 2010-12-07 LAB — PROTIME-INR
INR: 1
INR: 1.1
INR: 1.1
INR: 1.6 — ABNORMAL HIGH
INR: 2.2 — ABNORMAL HIGH
INR: 2.3 — ABNORMAL HIGH
INR: 2.8 — ABNORMAL HIGH
Prothrombin Time: 13.2
Prothrombin Time: 14.7
Prothrombin Time: 19 — ABNORMAL HIGH
Prothrombin Time: 19.5 — ABNORMAL HIGH

## 2010-12-07 LAB — POCT I-STAT 7, (LYTES, BLD GAS, ICA,H+H)
Acid-base deficit: 5 — ABNORMAL HIGH
Bicarbonate: 20.5
Calcium, Ion: 1.22
HCT: 49
Hemoglobin: 16.7
O2 Saturation: 100
Operator id: 244861
Patient temperature: 98.4
Potassium: 4.3
Sodium: 141
TCO2: 22
pCO2 arterial: 39
pH, Arterial: 7.328 — ABNORMAL LOW
pO2, Arterial: 246 — ABNORMAL HIGH

## 2010-12-07 LAB — BASIC METABOLIC PANEL
BUN: 24 — ABNORMAL HIGH
BUN: 30 — ABNORMAL HIGH
BUN: 31 — ABNORMAL HIGH
BUN: 35 — ABNORMAL HIGH
BUN: 37 — ABNORMAL HIGH
CO2: 22
CO2: 27
CO2: 28
Calcium: 9.8
Chloride: 110
Chloride: 102
Chloride: 102
Chloride: 108
Chloride: 110
Chloride: 110
Creatinine, Ser: 1.49
Creatinine, Ser: 1.61 — ABNORMAL HIGH
Creatinine, Ser: 1.68 — ABNORMAL HIGH
Creatinine, Ser: 1.84 — ABNORMAL HIGH
GFR calc Af Amer: >60
GFR calc Af Amer: 43 — ABNORMAL LOW
GFR calc Af Amer: 48 — ABNORMAL LOW
GFR calc Af Amer: 49 — ABNORMAL LOW
GFR calc Af Amer: 52 — ABNORMAL LOW
GFR calc Af Amer: 53 — ABNORMAL LOW
GFR calc Af Amer: 57 — ABNORMAL LOW
GFR calc non Af Amer: 41 — ABNORMAL LOW
GFR calc non Af Amer: 41 — ABNORMAL LOW
GFR calc non Af Amer: 44 — ABNORMAL LOW
GFR calc non Af Amer: 45 — ABNORMAL LOW
Glucose, Bld: 144 — ABNORMAL HIGH
Glucose, Bld: 134 — ABNORMAL HIGH
Potassium: 4.1
Potassium: 4.1
Potassium: 4.4
Potassium: 4.5
Potassium: 5.7 — ABNORMAL HIGH
Potassium: 5.9 — ABNORMAL HIGH
Sodium: 140
Sodium: 136
Sodium: 137
Sodium: 139

## 2010-12-07 LAB — D-DIMER, QUANTITATIVE: D-Dimer, Quant: 0.62 — ABNORMAL HIGH

## 2010-12-07 LAB — DIFFERENTIAL
Basophils Absolute: 0
Basophils Relative: 1
Eosinophils Absolute: 0.5
Eosinophils Relative: 5
Lymphocytes Relative: 26
Lymphs Abs: 2.6
Monocytes Absolute: 0.7
Monocytes Relative: 7
Neutro Abs: 6.4
Neutrophils Relative %: 62

## 2010-12-07 LAB — HEPARIN LEVEL (UNFRACTIONATED)
Heparin Unfractionated: 0.31
Heparin Unfractionated: 0.48
Heparin Unfractionated: 0.48
Heparin Unfractionated: 0.82 — ABNORMAL HIGH
Heparin Unfractionated: 0.99 — ABNORMAL HIGH

## 2010-12-07 LAB — CARDIAC PANEL(CRET KIN+CKTOT+MB+TROPI)
CK, MB: 3.4
Relative Index: INVALID
Relative Index: 2.5
Total CK: 44
Troponin I: 0.04
Troponin I: 0.05

## 2010-12-07 LAB — B-NATRIURETIC PEPTIDE (CONVERTED LAB)
Pro B Natriuretic peptide (BNP): 1054 — ABNORMAL HIGH
Pro B Natriuretic peptide (BNP): 601 — ABNORMAL HIGH
Pro B Natriuretic peptide (BNP): 1060 — ABNORMAL HIGH
Pro B Natriuretic peptide (BNP): 1100 — ABNORMAL HIGH
Pro B Natriuretic peptide (BNP): 577 — ABNORMAL HIGH
Pro B Natriuretic peptide (BNP): 665 — ABNORMAL HIGH
Pro B Natriuretic peptide (BNP): 894 — ABNORMAL HIGH

## 2010-12-07 LAB — POCT CARDIAC MARKERS
CKMB, poc: 1.2
CKMB, poc: 1.9
Myoglobin, poc: 125
Myoglobin, poc: 67.7
Operator id: 277751
Operator id: 277751
Troponin i, poc: <0.05
Troponin i, poc: <0.05

## 2010-12-07 LAB — CBC
HCT: 46.6
HCT: 40.4
HCT: 41.4
HCT: 44.5
HCT: 48.3
Hemoglobin: 14.9
Hemoglobin: 15.7
Hemoglobin: 13.6
Hemoglobin: 13.8
Hemoglobin: 15.7
Hemoglobin: 16.1
MCHC: 33.6
MCHC: 33.8
MCHC: 33.4
MCV: 90.3
MCV: 90.4
MCV: 90.8
MCV: 91
MCV: 91.5
Platelets: 214
Platelets: 163
Platelets: 177
Platelets: 189
RBC: 4.89
RBC: 5.16
RBC: 4.47
RBC: 4.52
RBC: 4.91
RBC: 5.1
RBC: 5.35
RDW: 14.5
RDW: 14.6
RDW: 14.2
WBC: 10.3
WBC: 11.3 — ABNORMAL HIGH
WBC: 11.4 — ABNORMAL HIGH
WBC: 12.8 — ABNORMAL HIGH
WBC: 8.5
WBC: 8.8
WBC: 9.2
WBC: 9.9

## 2010-12-07 LAB — COMPREHENSIVE METABOLIC PANEL
BUN: 21
CO2: 19
Calcium: 9.5
Chloride: 110
Creatinine, Ser: 1.38
GFR calc Af Amer: 60 — ABNORMAL LOW
GFR calc non Af Amer: 49 — ABNORMAL LOW
Glucose, Bld: 169 — ABNORMAL HIGH
Total Bilirubin: 0.4

## 2010-12-07 LAB — COMPREHENSIVE METABOLIC PANEL WITH GFR
ALT: 19
AST: 16
Albumin: 3.7
Alkaline Phosphatase: 65
Potassium: 3.9
Sodium: 141
Total Protein: 6.8

## 2010-12-07 LAB — CK TOTAL AND CKMB (NOT AT ARMC)
CK, MB: 3.7
Relative Index: INVALID
Total CK: 56

## 2010-12-07 LAB — APTT: aPTT: 28

## 2010-12-07 LAB — POCT I-STAT 3, ART BLOOD GAS (G3+)
Acid-base deficit: 6 — ABNORMAL HIGH
Bicarbonate: 19.1 — ABNORMAL LOW
O2 Saturation: 86
Operator id: 270221
Patient temperature: 98.6
TCO2: 20
pCO2 arterial: 35.2
pH, Arterial: 7.343 — ABNORMAL LOW
pO2, Arterial: 53 — ABNORMAL LOW

## 2010-12-07 LAB — LIPID PANEL
Cholesterol: 150
HDL: 59
Triglycerides: 28

## 2010-12-07 LAB — TSH: TSH: 0.658

## 2010-12-07 LAB — TROPONIN I: Troponin I: 0.06

## 2010-12-08 LAB — CBC
HCT: 49.4
Hemoglobin: 17
MCV: 89.3
Platelets: 201
WBC: 8.3

## 2010-12-09 LAB — PROTIME-INR
INR: 1.2
INR: 1.2
Prothrombin Time: 15.5 — ABNORMAL HIGH
Prothrombin Time: 15.7 — ABNORMAL HIGH
Prothrombin Time: 17.3 — ABNORMAL HIGH

## 2010-12-10 LAB — PROTIME-INR
INR: 2.1 — ABNORMAL HIGH
Prothrombin Time: 25.1 — ABNORMAL HIGH

## 2010-12-10 LAB — BASIC METABOLIC PANEL
BUN: 32 — ABNORMAL HIGH
Calcium: 10
Creatinine, Ser: 1.91 — ABNORMAL HIGH
GFR calc non Af Amer: 34 — ABNORMAL LOW
Glucose, Bld: 119 — ABNORMAL HIGH
Sodium: 140

## 2010-12-10 LAB — CBC
Platelets: 204
RDW: 14.8

## 2010-12-14 ENCOUNTER — Other Ambulatory Visit: Payer: Self-pay | Admitting: *Deleted

## 2010-12-14 MED ORDER — MECLIZINE HCL 25 MG PO TABS: 25.0000 mg | ORAL_TABLET | Freq: Three times a day (TID) | ORAL | Status: DC | PRN

## 2011-01-27 ENCOUNTER — Encounter: Payer: Medicare Other | Admitting: *Deleted

## 2011-02-02 ENCOUNTER — Encounter: Payer: Self-pay | Admitting: *Deleted

## 2011-02-07 ENCOUNTER — Encounter: Payer: Self-pay | Admitting: Internal Medicine

## 2011-02-07 ENCOUNTER — Ambulatory Visit (INDEPENDENT_AMBULATORY_CARE_PROVIDER_SITE_OTHER): Payer: Medicare Other | Admitting: Internal Medicine

## 2011-02-07 DIAGNOSIS — I495 Sick sinus syndrome: Secondary | ICD-10-CM

## 2011-02-07 DIAGNOSIS — I5022 Chronic systolic (congestive) heart failure: Secondary | ICD-10-CM

## 2011-02-07 DIAGNOSIS — I4891 Unspecified atrial fibrillation: Secondary | ICD-10-CM

## 2011-02-07 DIAGNOSIS — I472 Ventricular tachycardia: Secondary | ICD-10-CM

## 2011-02-07 LAB — PACEMAKER DEVICE OBSERVATION
AL IMPEDENCE PM: 464 Ohm
AL THRESHOLD: 1 V
BATTERY VOLTAGE: 2.99 V
RV LEAD AMPLITUDE: 11.4 mv
RV LEAD IMPEDENCE PM: 400 Ohm

## 2011-02-07 LAB — HEPATIC FUNCTION PANEL
AST: 24 U/L (ref 0–37)
Albumin: 3.9 g/dL (ref 3.5–5.2)
Alkaline Phosphatase: 72 U/L (ref 39–117)
Bilirubin, Direct: 0.1 mg/dL (ref 0.0–0.3)
Total Protein: 6.7 g/dL (ref 6.0–8.3)

## 2011-02-07 NOTE — Assessment & Plan Note (Signed)
Stable NYHA Class II symptoms No changes today

## 2011-02-07 NOTE — Assessment & Plan Note (Signed)
Controlled with amiodarone Check LFTs and PFTs today Consider coumadin/ xarelto/ pradaxa upon return or if his afib burden increases  Given photosensitivity, I have recommended decreasing amiodarone to 100mg  daily, but he is reluctant to do so at this time.

## 2011-02-07 NOTE — Patient Instructions (Addendum)
Your physician wants you to follow-up in: 6 months with Mark Hess and 12 months with Mark Hess will receive a reminder letter in the mail two months in advance. If you don't receive a letter, please call our office to schedule the follow-up appointment.  Your physician recommends that you return for lab work today  TSH/LIVER/T4  Remote monitoring is used to monitor your Pacemaker of ICD from home. This monitoring reduces the number of office visits required to check your device to one time per year. It allows [place] to keep an eye on the functioning of your device to ensure it is working properly. You are scheduled for a device check from home on 05/12/11 You may send your transmission at any time that day. If you have a wireless device, the transmission will be sent automatically. After your physician reviews your transmission, you will receive a postcard with your next transmission date.

## 2011-02-07 NOTE — Assessment & Plan Note (Signed)
Normal pacemaker function See Pace Art report No changes today  

## 2011-02-07 NOTE — Progress Notes (Signed)
No primary provider on file.  The patient presents today for routine electrophysiology followup.  Since last being seen in our clinic, the patient reports doing very well.  He remains active despite his age.  Chronic headaches and back pain continue to be his primary issues. Today, he denies symptoms of palpitations, chest pain, shortness of breath, orthopnea, PND, lower extremity edema, presyncope, syncope, or neurologic sequela.  The patient feels that he is tolerating medications without difficulties and is otherwise without complaint today.   Past Medical History  Diagnosis Date  . CHF (congestive heart failure)     EF 30 to 35% per echo in Jan 2011  . Coronary artery disease     Mild CAD per cath in 2009  . Atrial fibrillation     maintained in sinus on amiodarone  . Nonsustained ventricular tachycardia   . Alcohol abuse   . Diabetes mellitus   . Hypertension   . Tobacco abuse     quit  . COPD (chronic obstructive pulmonary disease)   . GERD (gastroesophageal reflux disease)   . Gout   . Dizziness     Chronic   Past Surgical History  Procedure Date  . Transthoracic echocardiogram 03/2009    SHOWED EF IN THE 30 to 35% RANGE  . Cardiac catheterization 06/26/2007    THERE WAS GLOBAL HYPOKINESUS AND EF 15-20%. SHOWED MILD CORONARY DISEASE WITH A 50% DISTAL RIGHT CORONARY IN 20-30% SCATTERED NARROWINGS IN THE LEFT CIRCUMFLEX. HE DID HAVE GLOBAL LV DYSFUNCTION AT THAT TIME  . Insert / replace / remove pacemaker 08/2007  . Cholecystectomy   . Cardioversion   . Cardiovascular stress test 03/2009    Current Outpatient Prescriptions  Medication Sig Dispense Refill  . allopurinol (ZYLOPRIM) 300 MG tablet Take 300 mg by mouth daily. 1/2 tablet daily      . amiodarone (PACERONE) 200 MG tablet Take 200 mg by mouth daily.        Marland Kitchen aspirin 81 MG tablet Take 81 mg by mouth daily.        . benazepril (LOTENSIN) 10 MG tablet Take 10 mg by mouth daily.        . meclizine (ANTIVERT) 25 MG  tablet Take 1 tablet (25 mg total) by mouth 3 (three) times daily as needed.  90 tablet  3    No Known Allergies  History   Social History  . Marital Status: Married    Spouse Name: N/A    Number of Children: N/A  . Years of Education: N/A   Occupational History  . Not on file.   Social History Main Topics  . Smoking status: Former Smoker    Quit date: 05/13/2007  . Smokeless tobacco: Current User    Types: Chew  . Alcohol Use: No  . Drug Use: No  . Sexually Active: Not on file   Other Topics Concern  . Not on file   Social History Narrative  . No narrative on file    Family History  Problem Relation Age of Onset  . Heart attack Brother   . Atrial fibrillation Son   . Arrhythmia Son    Physical Exam: Filed Vitals:   02/07/11 0905  BP: 121/76  Pulse: 72  Resp: 18  Height: 5\' 6"  (1.676 m)  Weight: 148 lb 12.8 oz (67.495 kg)    GEN- The patient is well appearing, alert and oriented x 3 today.   Head- normocephalic, atraumatic Eyes-  Sclera clear, conjunctiva pink Ears- hearing  intact Oropharynx- clear Neck- supple, no JVP Lungs- Clear to ausculation bilaterally, normal work of breathing Chest- pacemaker pocket is well healed Heart- Regular rate and rhythm, no murmurs, rubs or gallops, PMI not laterally displaced GI- soft, NT, ND, + BS Extremities- no clubbing, cyanosis, or edema  Pacemaker interrogation- reviewed in detail today,  See PACEART report  Assessment and Plan:

## 2011-02-23 ENCOUNTER — Other Ambulatory Visit: Payer: Self-pay | Admitting: Internal Medicine

## 2011-02-23 MED ORDER — ALLOPURINOL 300 MG PO TABS: 300.0000 mg | ORAL_TABLET | Freq: Every day | ORAL | Status: DC

## 2011-05-11 ENCOUNTER — Ambulatory Visit (INDEPENDENT_AMBULATORY_CARE_PROVIDER_SITE_OTHER): Payer: Medicare Other | Admitting: *Deleted

## 2011-05-11 ENCOUNTER — Encounter: Payer: Self-pay | Admitting: Internal Medicine

## 2011-05-11 DIAGNOSIS — I4891 Unspecified atrial fibrillation: Secondary | ICD-10-CM

## 2011-05-11 DIAGNOSIS — I472 Ventricular tachycardia: Secondary | ICD-10-CM

## 2011-05-11 DIAGNOSIS — I495 Sick sinus syndrome: Secondary | ICD-10-CM

## 2011-05-13 LAB — REMOTE PACEMAKER DEVICE
AL AMPLITUDE: 1.2 mv
ATRIAL PACING PM: 99.34
BATTERY VOLTAGE: 2.99 V
RV LEAD IMPEDENCE PM: 384 Ohm
VENTRICULAR PACING PM: 2.11

## 2011-05-27 ENCOUNTER — Encounter: Payer: Self-pay | Admitting: Internal Medicine

## 2011-05-30 ENCOUNTER — Encounter: Payer: Self-pay | Admitting: Cardiology

## 2011-06-03 NOTE — Progress Notes (Signed)
Remote pacer check  

## 2011-08-11 ENCOUNTER — Encounter: Payer: Medicare Other | Admitting: *Deleted

## 2011-08-17 ENCOUNTER — Encounter: Payer: Self-pay | Admitting: *Deleted

## 2011-08-23 ENCOUNTER — Encounter: Payer: Self-pay | Admitting: *Deleted

## 2011-08-23 ENCOUNTER — Other Ambulatory Visit: Payer: Self-pay | Admitting: *Deleted

## 2011-08-23 MED ORDER — MECLIZINE HCL 25 MG PO TABS: 25.0000 mg | ORAL_TABLET | Freq: Three times a day (TID) | ORAL | Status: DC | PRN

## 2011-10-31 ENCOUNTER — Other Ambulatory Visit: Payer: Self-pay

## 2011-11-09 ENCOUNTER — Encounter: Payer: Self-pay | Admitting: *Deleted

## 2011-11-17 ENCOUNTER — Encounter: Payer: Self-pay | Admitting: Cardiology

## 2011-11-17 ENCOUNTER — Ambulatory Visit (INDEPENDENT_AMBULATORY_CARE_PROVIDER_SITE_OTHER): Payer: Medicare Other | Admitting: Cardiology

## 2011-11-17 ENCOUNTER — Encounter: Payer: Self-pay | Admitting: Internal Medicine

## 2011-11-17 VITALS — BP 120/72 | HR 89 | Ht 69.0 in | Wt 144.1 lb

## 2011-11-17 DIAGNOSIS — R001 Bradycardia, unspecified: Secondary | ICD-10-CM

## 2011-11-17 DIAGNOSIS — Z950 Presence of cardiac pacemaker: Secondary | ICD-10-CM

## 2011-11-17 NOTE — Progress Notes (Signed)
Device check only. See PaceArt report. 

## 2011-11-30 LAB — PACEMAKER DEVICE OBSERVATION
AL THRESHOLD: 1 V
ATRIAL PACING PM: 97.7
BAMS-0001: 170 {beats}/min
RV LEAD IMPEDENCE PM: 392 Ohm
RV LEAD THRESHOLD: 1 V
VENTRICULAR PACING PM: 4.8

## 2012-02-10 ENCOUNTER — Encounter: Payer: Self-pay | Admitting: *Deleted

## 2012-02-10 DIAGNOSIS — Z950 Presence of cardiac pacemaker: Secondary | ICD-10-CM | POA: Insufficient documentation

## 2012-02-17 ENCOUNTER — Ambulatory Visit (INDEPENDENT_AMBULATORY_CARE_PROVIDER_SITE_OTHER): Payer: Medicare Other | Admitting: Internal Medicine

## 2012-02-17 ENCOUNTER — Encounter: Payer: Self-pay | Admitting: Internal Medicine

## 2012-02-17 VITALS — BP 138/76 | HR 92 | Ht 69.0 in | Wt 146.0 lb

## 2012-02-17 DIAGNOSIS — Z950 Presence of cardiac pacemaker: Secondary | ICD-10-CM

## 2012-02-17 DIAGNOSIS — I4891 Unspecified atrial fibrillation: Secondary | ICD-10-CM

## 2012-02-17 LAB — TSH: TSH: 0.13 u[IU]/mL — ABNORMAL LOW (ref 0.35–5.50)

## 2012-02-17 LAB — PACEMAKER DEVICE OBSERVATION
AL AMPLITUDE: 1 mv
AL THRESHOLD: 0.5 V
BAMS-0001: 170 {beats}/min
RV LEAD AMPLITUDE: 10.4 mv
RV LEAD IMPEDENCE PM: 384 Ohm
RV LEAD THRESHOLD: 0.5 V
VENTRICULAR PACING PM: 3.1

## 2012-02-17 LAB — HEPATIC FUNCTION PANEL
ALT: 18 U/L (ref 0–53)
Bilirubin, Direct: 0.1 mg/dL (ref 0.0–0.3)
Total Bilirubin: 0.3 mg/dL (ref 0.3–1.2)

## 2012-02-17 LAB — BASIC METABOLIC PANEL
BUN: 36 mg/dL — ABNORMAL HIGH (ref 6–23)
Calcium: 9.6 mg/dL (ref 8.4–10.5)
Chloride: 112 mEq/L (ref 96–112)
Creatinine, Ser: 2.3 mg/dL — ABNORMAL HIGH (ref 0.4–1.5)
GFR: 29.24 mL/min — ABNORMAL LOW (ref >60.00)

## 2012-02-17 MED ORDER — AMIODARONE HCL 200 MG PO TABS: 100.0000 mg | ORAL_TABLET | Freq: Every day | ORAL | Status: DC

## 2012-02-17 NOTE — Patient Instructions (Addendum)
Remote monitoring is used to monitor your Pacemaker of ICD from home. This monitoring reduces the number of office visits required to check your device to one time per year. It allows [place] to keep an eye on the functioning of your device to ensure it is working properly. You are scheduled for a device check from home on May 21, 2012. You may send your transmission at any time that day. If you have a wireless device, the transmission will be sent automatically. After your physician reviews your transmission, you will receive a postcard with your next transmission date.  Your physician wants you to follow-up in: 6 months with Sunday Spillers and 1 year with Dr Johney Frame.  You will receive a reminder letter in the mail two months in advance. If you don't receive a letter, please call our office to schedule the follow-up appointment.  Your physician has recommended you make the following change in your medication:  1) Continue Aspirin 2) Decrease Amiodarone to 100mg 

## 2012-02-17 NOTE — Progress Notes (Signed)
PCP: [name], MD  Mark Hess is a 78 y.o. male who presents today for routine electrophysiology followup.  Since last being seen in our clinic, the patient reports doing very well.  Today, he denies symptoms of palpitations, chest pain, shortness of breath,  lower extremity edema, dizziness, presyncope, or syncope.  The patient is otherwise without complaint today.   Past Medical History  Diagnosis Date  . CHF (congestive heart failure)     EF 30 to 35% per echo in Jan 2011  . Coronary artery disease     Mild CAD per cath in 2009  . Atrial fibrillation     maintained in sinus on amiodarone  . Nonsustained ventricular tachycardia   . Alcohol abuse   . Diabetes mellitus   . Hypertension   . Tobacco abuse     quit  . COPD (chronic obstructive pulmonary disease)   . GERD (gastroesophageal reflux disease)   . Gout   . Dizziness     Chronic   Past Surgical History  Procedure Date  . Transthoracic echocardiogram 03/2009    SHOWED EF IN THE 30 to 35% RANGE  . Cardiac catheterization 06/26/2007    THERE WAS GLOBAL HYPOKINESUS AND EF 15-20%. SHOWED MILD CORONARY DISEASE WITH A 50% DISTAL RIGHT CORONARY IN 20-30% SCATTERED NARROWINGS IN THE LEFT CIRCUMFLEX. HE DID HAVE GLOBAL LV DYSFUNCTION AT THAT TIME  . Insert / replace / remove pacemaker 08/2007  . Cholecystectomy   . Cardioversion   . Cardiovascular stress test 03/2009    Current Outpatient Prescriptions  Medication Sig Dispense Refill  . allopurinol (ZYLOPRIM) 300 MG tablet Take 1 tablet (300 mg total) by mouth daily.  30 tablet  11  . amiodarone (PACERONE) 200 MG tablet Take 200 mg by mouth daily.        Marland Kitchen aspirin 81 MG tablet Take 81 mg by mouth daily.        . benazepril (LOTENSIN) 10 MG tablet Take 10 mg by mouth daily.        . CYCLOBENZAPRINE HCL PO Take 10 mg by mouth at bedtime as needed.      . meclizine (ANTIVERT) 25 MG tablet Take 1 tablet (25 mg total) by mouth 3 (three) times daily as needed.  90 tablet   1    Physical Exam: Filed Vitals:   02/17/12 1007  BP: 138/76  Pulse: 92  Height: 5\' 9"  (1.753 m)  Weight: 146 lb (66.225 kg)    GEN- The patient is well appearing, alert and oriented x 3 today.   Head- normocephalic, atraumatic Eyes-  Sclera clear, conjunctiva pink Ears- hearing intact Oropharynx- clear Lungs- Clear to ausculation bilaterally, normal work of breathing Chest- pacemaker pocket is well healed Heart- Regular rate and rhythm, no murmurs, rubs or gallops, PMI not laterally displaced GI- soft, NT, ND, + BS Extremities- no clubbing, cyanosis, or edema  Pacemaker interrogation- reviewed in detail today,  See PACEART report  Assessment and Plan:  1. Bradycardia Normal pacemaker function See Pace Art report No changes today  2. afib Well controlled with amiodarone Will decrease amiodarone to 100mg  daily at this time He will contemplate coumadin Given renal failure and advanced age, I think that he is too high risk for a novel anticoagulant at this time  Follow-up with Lawson Fiscal in 6 months I will see in 12 months

## 2012-03-02 ENCOUNTER — Other Ambulatory Visit: Payer: Self-pay | Admitting: *Deleted

## 2012-03-02 MED ORDER — ALLOPURINOL 300 MG PO TABS: 300.0000 mg | ORAL_TABLET | Freq: Every day | ORAL | Status: DC

## 2012-05-09 ENCOUNTER — Ambulatory Visit (INDEPENDENT_AMBULATORY_CARE_PROVIDER_SITE_OTHER): Payer: Medicare Other | Admitting: Physician Assistant

## 2012-05-09 ENCOUNTER — Ambulatory Visit (INDEPENDENT_AMBULATORY_CARE_PROVIDER_SITE_OTHER)
Admission: RE | Admit: 2012-05-09 | Discharge: 2012-05-09 | Disposition: A | Discharge status: Home / Self Care | Payer: Medicare Other | Source: Ambulatory Visit | Attending: Physician Assistant | Admitting: Physician Assistant

## 2012-05-09 ENCOUNTER — Other Ambulatory Visit (INDEPENDENT_AMBULATORY_CARE_PROVIDER_SITE_OTHER): Payer: Medicare Other

## 2012-05-09 ENCOUNTER — Encounter: Payer: Self-pay | Admitting: Physician Assistant

## 2012-05-09 VITALS — BP 118/70 | HR 74 | Ht 69.0 in | Wt 141.4 lb

## 2012-05-09 DIAGNOSIS — I2510 Atherosclerotic heart disease of native coronary artery without angina pectoris: Secondary | ICD-10-CM

## 2012-05-09 DIAGNOSIS — I5022 Chronic systolic (congestive) heart failure: Secondary | ICD-10-CM

## 2012-05-09 DIAGNOSIS — J449 Chronic obstructive pulmonary disease, unspecified: Secondary | ICD-10-CM

## 2012-05-09 DIAGNOSIS — R0602 Shortness of breath: Secondary | ICD-10-CM

## 2012-05-09 DIAGNOSIS — I4891 Unspecified atrial fibrillation: Secondary | ICD-10-CM

## 2012-05-09 DIAGNOSIS — R079 Chest pain, unspecified: Secondary | ICD-10-CM

## 2012-05-09 LAB — CBC WITH DIFFERENTIAL/PLATELET
Basophils Absolute: 0.1 10*3/uL (ref 0.0–0.1)
Eosinophils Relative: 2.7 % (ref 0.0–5.0)
HCT: 41.2 % (ref 39.0–52.0)
Lymphocytes Relative: 20.7 % (ref 12.0–46.0)
Lymphs Abs: 1.7 10*3/uL (ref 0.7–4.0)
Monocytes Relative: 11.2 % (ref 3.0–12.0)
Neutrophils Relative %: 64.6 % (ref 43.0–77.0)
Platelets: 205 10*3/uL (ref 150.0–400.0)
RDW: 16.7 % — ABNORMAL HIGH (ref 11.5–14.6)
WBC: 8.4 10*3/uL (ref 4.5–10.5)

## 2012-05-09 LAB — BASIC METABOLIC PANEL
BUN: 38 mg/dL — ABNORMAL HIGH (ref 6–23)
Calcium: 9.6 mg/dL (ref 8.4–10.5)
Creatinine, Ser: 2.4 mg/dL — ABNORMAL HIGH (ref 0.4–1.5)
GFR: 27.02 mL/min — ABNORMAL LOW (ref >60.00)
Glucose, Bld: 82 mg/dL (ref 70–99)
Potassium: 5.9 mEq/L — ABNORMAL HIGH (ref 3.5–5.1)

## 2012-05-09 LAB — BRAIN NATRIURETIC PEPTIDE: Pro B Natriuretic peptide (BNP): 944 pg/mL — ABNORMAL HIGH (ref 0.0–100.0)

## 2012-05-09 MED ORDER — NITROGLYCERIN 0.4 MG SL SUBL: 0.4000 mg | SUBLINGUAL_TABLET | SUBLINGUAL | Status: AC | PRN

## 2012-05-09 MED ORDER — BENAZEPRIL HCL 10 MG PO TABS: 5.0000 mg | ORAL_TABLET | Freq: Every day | ORAL | Status: DC

## 2012-05-09 MED ORDER — ISOSORBIDE MONONITRATE ER 30 MG PO TB24: 15.0000 mg | ORAL_TABLET | Freq: Every day | ORAL | Status: DC

## 2012-05-09 MED ORDER — CARVEDILOL 3.125 MG PO TABS: 3.1250 mg | ORAL_TABLET | Freq: Two times a day (BID) | ORAL | Status: DC

## 2012-05-09 NOTE — Progress Notes (Signed)
[address] [place], [place]  [address] Phone: [phone], Fax:  [phone]  Date:  05/09/2012   ID:  Mark Hess, DOB 1923/09/22, MRN [id number]  PCP:  [name], MD  Primary Cardiologist/Electrophysiologist:  Dr. Hillis Range    History of Present Illness: Mark Hess is a 79 y.o. male who returns for evaluation of chest pain and dyspnea.  He has a history of systolic CHF, nonischemic cardiomyopathy, CAD, atrial fibrillation maintaining sinus rhythm on amiodarone, status post pacemaker, nonsustained ventricular tachycardia, DM 2, HTN, COPD. LHC 4/09: EF 15-20%, distal RCA 50%, LAD irregularities, circumflex irregularities. Echocardiogram 4/09: EF 20%, severe diffuse HK, moderate MR, mild LAE. TEE 4/09: EF 35-40%, mild MR. Last seen by Dr. Johney Frame 12/13.  He does not take Coumadin but that this has been discussed with him in the past.  He notes chest pain and dyspnea over the last 2-3 weeks.  He notes CP with exertion and at rest.  He denies radiation to his jaw but has noted bilateral arm pain (left > right).  He is not sure if arm pain is assoc with chest pain.  No syncope, assoc nausea or diaphoresis.  CP improves with rest.  He notes NYHA Class IIb-III symptoms.  No orthopnea, PND, edema.    Labs (12/13):  K 4.8, creatinine 2.3, ALT 18, TSH 0.13  Wt Readings from Last 3 Encounters:  05/09/12 141 lb 6.4 oz (64.139 kg)  02/17/12 146 lb (66.225 kg)  11/17/11 144 lb 1.9 oz (65.372 kg)     Past Medical History  Diagnosis Date  . CHF (congestive heart failure)     EF 30 to 35% per echo in Jan 2011  . Coronary artery disease     Mild CAD per cath in 2009  . Atrial fibrillation     maintained in sinus on amiodarone  . Nonsustained ventricular tachycardia   . Alcohol abuse   . Diabetes mellitus   . Hypertension   . Tobacco abuse     quit  . COPD (chronic obstructive pulmonary disease)   . GERD (gastroesophageal reflux disease)   .  Gout   . Dizziness     Chronic    Current Outpatient Prescriptions  Medication Sig Dispense Refill  . allopurinol (ZYLOPRIM) 300 MG tablet Take 1 tablet (300 mg total) by mouth daily.  30 tablet  11  . amiodarone (PACERONE) 200 MG tablet Take 0.5 tablets (100 mg total) by mouth daily.      Marland Kitchen aspirin 81 MG tablet Take 81 mg by mouth daily.        . benazepril (LOTENSIN) 10 MG tablet Take 10 mg by mouth daily.        . CYCLOBENZAPRINE HCL PO Take 10 mg by mouth at bedtime as needed.       No current facility-administered medications for this visit.    Allergies:   No Known Allergies  Social History:  The patient  reports that he quit smoking about 4 years ago. His smokeless tobacco use includes Chew. He reports that he does not drink alcohol or use illicit drugs.   ROS:  Please see the history of present illness.   Notes diffuse pruritus.  Has occasional dysphagia.  No chest pain related to meals.  No melena, hematochezia, fevers, cough.   All other systems reviewed and negative.   PHYSICAL EXAM: VS:  BP 118/70  Pulse 74  Ht 5\' 9"  (1.753 m)  Wt 141 lb 6.4 oz (  64.139 kg)  BMI 20.87 kg/m2  SpO2 89% Well nourished, well developed, in no acute distress HEENT: normal Neck: no JVD Vascular:  No carotid bruis Cardiac:  normal S1, S2; RRR; no murmur Lungs:  clear to auscultation bilaterally, no wheezing, rhonchi or rales Abd: soft, nontender, no hepatomegaly Ext: no edema Skin: warm and dry Neuro:  CNs 2-12 intact, no focal abnormalities noted  EKG:  AV paced, HR 74     ASSESSMENT AND PLAN:  1. Chest Pain:  Symptoms are concerning for angina.  However, with his advanced age and poor renal function, he is a poor candidate for aggressive invasive evaluation.  I discussed his case with Dr. Hillis Range who also saw him.  After further review with the patient and his wife, we decided to pursue conservative management only.  We will not pursue stress testing as we would likely not pursue  cardiac cath in the setting of an abnormal result with plans to pursue conservative management.  Therefore, we will advance medical Rx.  Decrease Benazepril to 5 mg QD.  Add Imdur 15 mg QD and Coreg 3.125 mg bid.  I will provide a Rx for NTG prn as well.  We will check an echo to reassess his LVF.  Continue ASA.  Check a BMET, BNP, CBC and CXR.  Plan early follow up.  He knows to go to the ED if his symptoms should worsen. 2. Chronic Systolic CHF:  Volume stable.  Do not suspect CHF as a cause of his symptoms.  Obtain Echo and BNP as noted and adjust Rx if needed. 3. Coronary Artery Disease:  Non-obstructive disease noted in 2009.  Continue medical Rx as noted. 4. COPD:  May explain decreased O2 sat.  Check Echo and CXR.  No symptoms of cough.  However, he is on amiodarone. 5. Atrial Fibrillation:  We interrogated his device today and no episodes of AFib noted.  6. Disposition:  Follow up with Dr. Johney Frame or me in 2 weeks.   Luna Glasgow, PA-C  11:28 AM 05/09/2012

## 2012-05-09 NOTE — Patient Instructions (Addendum)
YOU HAVE A FOLLOW UP APPT WITH SCOTT WEAVER, [facility] 05/25/12 @ 11:10 AM  LAB ( BMET, CBC W/DIFF, BNP) AND CXR TODAY TO BE DONE AT THE [facility] ON [address]  PLEASE SCHEDULE AND ECHO DX  786.05, 428.22, 427.31, 786.50  START COREG 3.125 MG 1 TABLET TWICE DAILY START IMDUR 30 MG TABLET YOU WILL TAKE 15 MG (1/2 TABLET) DAILY  DECREASE BENAZEPRIL TO 5 MG (1/2 TAB) DAILY   AN RX HAS BEEN SENT IN FOR NTG AND YOU HAVE BEEN ADVISED AS TO HOW AND WHEN TO USE NITROGLYCERIN

## 2012-05-10 ENCOUNTER — Telehealth: Payer: Self-pay | Admitting: *Deleted

## 2012-05-10 ENCOUNTER — Other Ambulatory Visit (INDEPENDENT_AMBULATORY_CARE_PROVIDER_SITE_OTHER): Payer: Medicare Other

## 2012-05-10 DIAGNOSIS — I5022 Chronic systolic (congestive) heart failure: Secondary | ICD-10-CM

## 2012-05-10 LAB — BASIC METABOLIC PANEL
BUN: 38 mg/dL — ABNORMAL HIGH (ref 6–23)
Chloride: 112 mEq/L (ref 96–112)
GFR: 24.55 mL/min — ABNORMAL LOW (ref >60.00)
Potassium: 5.4 mEq/L — ABNORMAL HIGH (ref 3.5–5.1)

## 2012-05-10 LAB — BRAIN NATRIURETIC PEPTIDE: Pro B Natriuretic peptide (BNP): 861 pg/mL — ABNORMAL HIGH (ref 0.0–100.0)

## 2012-05-10 MED ORDER — FUROSEMIDE 20 MG PO TABS: 20.0000 mg | ORAL_TABLET | Freq: Every day | ORAL | Status: DC

## 2012-05-10 NOTE — Telephone Encounter (Signed)
Message copied by Tarri Fuller on Thu May 10, 2012 11:33 AM ------      Message from: [place], [place] T      Created: Wed May 09, 2012  6:06 PM       K too high        -  Stop the benazepril        -  Stop any K+ supplements        -  Avoid K+ in diet        -  Repeat stat BMET on 2/27      Creatinine stable      Hemoglobin normal      BNP high        -  Start low dose Lasix 20 mg QD        -  Repeat BMET tomorrow and again Monday next week along with a repeat BNP        -  CXR still pending      Tereso Newcomer, PA-C  6:05 PM 05/09/2012 ------

## 2012-05-10 NOTE — Telephone Encounter (Signed)
wife notified ok to have repeat bmet, bnp on 3/5 when coming in for echo

## 2012-05-10 NOTE — Telephone Encounter (Signed)
Message copied by Tarri Fuller on Thu May 10, 2012 12:08 PM ------      Message from: [place], [place] T      Created: Wed May 09, 2012  6:06 PM       K too high        -  Stop the benazepril        -  Stop any K+ supplements        -  Avoid K+ in diet        -  Repeat stat BMET on 2/27      Creatinine stable      Hemoglobin normal      BNP high        -  Start low dose Lasix 20 mg QD        -  Repeat BMET tomorrow and again Monday next week along with a repeat BNP        -  CXR still pending      Tereso Newcomer, PA-C  6:05 PM 05/09/2012 ------

## 2012-05-10 NOTE — Telephone Encounter (Signed)
s/w pt's wife who is notified about lab /cxr results with verbal read back from wife. bmet today, repeat bmet/bnp 3/3

## 2012-05-11 ENCOUNTER — Telehealth: Payer: Self-pay | Admitting: *Deleted

## 2012-05-11 NOTE — Telephone Encounter (Signed)
Message copied by Tarri Fuller on Fri May 11, 2012 12:03 PM ------      Message from: [place], [place] T      Created: Thu May 10, 2012  4:40 PM       K+ stable      Continue with current treatment plan.      Remain off of benazepril      Repeat BMET next week as planned.      Tereso Newcomer, PA-C  4:39 PM 05/10/2012 ------

## 2012-05-11 NOTE — Telephone Encounter (Signed)
wife notified about lab results with verbal understanding 

## 2012-05-16 ENCOUNTER — Ambulatory Visit (HOSPITAL_COMMUNITY): Payer: Medicare Other | Attending: Cardiology | Admitting: Radiology

## 2012-05-16 ENCOUNTER — Ambulatory Visit (INDEPENDENT_AMBULATORY_CARE_PROVIDER_SITE_OTHER): Payer: Medicare Other | Admitting: *Deleted

## 2012-05-16 ENCOUNTER — Telehealth: Payer: Self-pay | Admitting: Internal Medicine

## 2012-05-16 ENCOUNTER — Encounter: Payer: Self-pay | Admitting: Physician Assistant

## 2012-05-16 DIAGNOSIS — I5022 Chronic systolic (congestive) heart failure: Secondary | ICD-10-CM | POA: Insufficient documentation

## 2012-05-16 DIAGNOSIS — R079 Chest pain, unspecified: Secondary | ICD-10-CM | POA: Insufficient documentation

## 2012-05-16 DIAGNOSIS — I4891 Unspecified atrial fibrillation: Secondary | ICD-10-CM | POA: Insufficient documentation

## 2012-05-16 DIAGNOSIS — I472 Ventricular tachycardia: Secondary | ICD-10-CM

## 2012-05-16 DIAGNOSIS — R0602 Shortness of breath: Secondary | ICD-10-CM

## 2012-05-16 DIAGNOSIS — I10 Essential (primary) hypertension: Secondary | ICD-10-CM

## 2012-05-16 DIAGNOSIS — R072 Precordial pain: Secondary | ICD-10-CM

## 2012-05-16 LAB — BASIC METABOLIC PANEL
Calcium: 9.2 mg/dL (ref 8.4–10.5)
Chloride: 108 mEq/L (ref 96–112)
Creatinine, Ser: 2.8 mg/dL — ABNORMAL HIGH (ref 0.4–1.5)
Sodium: 138 mEq/L (ref 135–145)

## 2012-05-16 LAB — BRAIN NATRIURETIC PEPTIDE: Pro B Natriuretic peptide (BNP): 636 pg/mL — ABNORMAL HIGH (ref 0.0–100.0)

## 2012-05-16 NOTE — Telephone Encounter (Signed)
Spoke with the patient and he says yesterday he had a headache and his left side hurt.  Today he feels good.  Let him know we will call him with his results and to keep his follow up appointment

## 2012-05-16 NOTE — Progress Notes (Signed)
Echocardiogram performed.  

## 2012-05-16 NOTE — Telephone Encounter (Signed)
Forwarded to [employer].

## 2012-05-16 NOTE — Telephone Encounter (Signed)
Pt has been feeling bad since a week ago and very weak pt was seen today for an echo and b/p was 95/50 today and they expressed wanting to see someone today but felt they where not heard so they would like to talk to someone regarding this especially since his medication has been changed as well.

## 2012-05-18 ENCOUNTER — Encounter: Payer: Self-pay | Admitting: Physician Assistant

## 2012-05-18 ENCOUNTER — Telehealth: Payer: Self-pay | Admitting: *Deleted

## 2012-05-18 NOTE — Telephone Encounter (Signed)
phone lines down due to weather. will try again on monday 3/10 to go over results

## 2012-05-18 NOTE — Telephone Encounter (Signed)
Message copied by Tarri Fuller on Fri May 18, 2012 12:17 PM ------      Message from: [place], [place] T      Created: Fri May 18, 2012 10:46 AM       EF c/w prior evaluations; no sig change      Continue with current treatment plan.      Tereso Newcomer, PA-C  10:46 AM 05/18/2012 ------

## 2012-05-21 ENCOUNTER — Encounter: Payer: Medicare Other | Admitting: *Deleted

## 2012-05-22 ENCOUNTER — Telehealth: Payer: Self-pay | Admitting: *Deleted

## 2012-05-22 NOTE — Telephone Encounter (Signed)
pt notified about echo and verbalized understanding today, has appt to see SW, PA 3/14

## 2012-05-22 NOTE — Telephone Encounter (Signed)
Message copied by Tarri Fuller on Tue May 22, 2012 12:59 PM ------      Message from: [place], [place] T      Created: Fri May 18, 2012 10:46 AM       EF c/w prior evaluations; no sig change      Continue with current treatment plan.      Tereso Newcomer, PA-C  10:46 AM 05/18/2012 ------

## 2012-05-25 ENCOUNTER — Ambulatory Visit (INDEPENDENT_AMBULATORY_CARE_PROVIDER_SITE_OTHER): Payer: Medicare Other | Admitting: Physician Assistant

## 2012-05-25 ENCOUNTER — Encounter: Payer: Self-pay | Admitting: Physician Assistant

## 2012-05-25 VITALS — BP 104/80 | HR 67 | Ht 69.0 in | Wt 135.1 lb

## 2012-05-25 DIAGNOSIS — R079 Chest pain, unspecified: Secondary | ICD-10-CM

## 2012-05-25 DIAGNOSIS — N189 Chronic kidney disease, unspecified: Secondary | ICD-10-CM

## 2012-05-25 DIAGNOSIS — I4891 Unspecified atrial fibrillation: Secondary | ICD-10-CM

## 2012-05-25 DIAGNOSIS — I2510 Atherosclerotic heart disease of native coronary artery without angina pectoris: Secondary | ICD-10-CM

## 2012-05-25 DIAGNOSIS — J449 Chronic obstructive pulmonary disease, unspecified: Secondary | ICD-10-CM | POA: Insufficient documentation

## 2012-05-25 DIAGNOSIS — I5022 Chronic systolic (congestive) heart failure: Secondary | ICD-10-CM

## 2012-05-25 LAB — BASIC METABOLIC PANEL
Chloride: 109 mEq/L (ref 96–112)
GFR: 24.88 mL/min — ABNORMAL LOW (ref >60.00)
Glucose, Bld: 96 mg/dL (ref 70–99)
Potassium: 4.4 mEq/L (ref 3.5–5.1)
Sodium: 138 mEq/L (ref 135–145)

## 2012-05-25 NOTE — Progress Notes (Signed)
[address], [address] [place], [place]  [address] Phone: [phone], Fax:  [phone]  Date:  05/25/2012   ID:  Mark Hess, DOB 06/18/1923, MRN [id number]  PCP:  Mark Mountain, MD  Primary Cardiologist/Electrophysiologist:  Dr. Hillis Hess    History of Present Illness: Mark Hess is a 79 y.o. male who returns for f/u of chest pain and dyspnea.  He has a history of systolic CHF, NICM, CAD, AFib maintaining sinus rhythm on amiodarone, s/p pacemaker, NSVT, DM2, HTN, COPD. LHC 4/09: EF 15-20%, distal RCA 50%, LAD irregularities, circumflex irregularities. Echocardiogram 4/09: EF 20%. TEE 4/09: EF 35-40%. Last seen by Dr. Johney Hess 12/13.  He does not take Coumadin but that this has been discussed with him in the past.  I saw him 2/26 with c/o's CP and DOE.  Symptoms were concerning for angina.  I saw him with Dr. Johney Hess and we thought he was not a good candidate for cardiac cath.  We decided to treat him medically.  We started coreg and imdur.  Labs indicated hyperkalemia and his ACE was stopped.  Creatinine remained stable and his K+ improved.  Echo was done and demonstrated stable EF at 20-25% and diffuse HK and mod MR.  Patient returns today and notes he has felt better over the last 2-3 days.  Notes less DOE.  Notes less CP as well.  No syncope, near syncope.  No orthopnea, PND, edema.  Weight is down 6 lbs.  He has had some HAs.  He can only tolerate coreg once daily.    Labs (12/13):  K 4.8, creatinine 2.3, ALT 18, TSH 0.13 Labs (2/14):    K 5.9 => 5.4, creatinine 2.4 => 2.6, BNP 944 => 636, Hgb 13.3 Labs (3/14):    K 4.2, creatinine 2.8  Wt Readings from Last 3 Encounters:  05/25/12 135 lb 1.9 oz (61.29 kg)  05/09/12 141 lb 6.4 oz (64.139 kg)  02/17/12 146 lb (66.225 kg)     Past Medical History  Diagnosis Date  . Chronic systolic heart failure     a. EF 30 to 35% per echo in Jan 2011; b. Echo 3/14:  mild LVH, EF 20-25%, diff HK, mod MR, mod LAE,  PASP 48  . Coronary artery disease     Mild CAD per cath in 2009  . Atrial fibrillation     maintained in sinus on amiodarone  . Nonsustained ventricular tachycardia   . Alcohol abuse   . Diabetes mellitus   . Hypertension   . Tobacco abuse     quit  . COPD (chronic obstructive pulmonary disease)   . GERD (gastroesophageal reflux disease)   . Gout   . Dizziness     Chronic    Current Outpatient Prescriptions  Medication Sig Dispense Refill  . allopurinol (ZYLOPRIM) 300 MG tablet Take 1 tablet (300 mg total) by mouth daily.  30 tablet  11  . amiodarone (PACERONE) 200 MG tablet Take 0.5 tablets (100 mg total) by mouth daily.      Marland Kitchen aspirin 81 MG tablet Take 81 mg by mouth daily.        . carvedilol (COREG) 3.125 MG tablet Take 3.125 mg by mouth daily.      . CYCLOBENZAPRINE HCL PO Take 10 mg by mouth at bedtime as needed.      . furosemide (LASIX) 20 MG tablet Take 1 tablet (20 mg total) by mouth daily.      . isosorbide mononitrate (IMDUR) 30  MG 24 hr tablet Take 0.5 tablets (15 mg total) by mouth daily.  30 tablet  3  . nitroGLYCERIN (NITROSTAT) 0.4 MG SL tablet Place 1 tablet (0.4 mg total) under the tongue every 5 (five) minutes as needed for chest pain.  25 tablet  3   No current facility-administered medications for this visit.    Allergies:   No Known Allergies  Social History:  The patient  reports that he quit smoking about 5 years ago. His smokeless tobacco use includes Chew. He reports that he does not drink alcohol or use illicit drugs.   ROS:  Please see the history of present illness.   He has a cough with white sputum production.  Has chronic LBP.  All other systems reviewed and negative.   PHYSICAL EXAM: VS:  BP 104/80  Pulse 67  Ht 5\' 9"  (1.753 m)  Wt 135 lb 1.9 oz (61.29 kg)  BMI 19.94 kg/m2  SpO2 90% Well nourished, well developed, in no acute distress HEENT: normal Neck: no JVD Cardiac:  normal S1, S2; RRR; no murmur Lungs:  clear to auscultation  bilaterally, no wheezing, rhonchi or rales Abd: soft, nontender, no hepatomegaly Ext: no edema Skin: warm and dry Neuro:  CNs 2-12 intact, no focal abnormalities noted  EKG:  AV paced, HR 67     ASSESSMENT AND PLAN:  1. Chest Pain:  Improved.  He may have angina from CAD or his CP may be from a/c systolic CHF.  As noted previously, given his advanced age and CKD, we will treat him conservatively.  He can only take Coreg QD.  He can continue this.  He will continue Imdur.  Continue ASA. 2. Chronic Systolic CHF:  His BNP was elevated c/w volume overload and feels better on Lasix.  Continue Lasix.  Check BMET today to follow up on renal fxn. 3. Coronary Artery Disease:  Continue medical Rx as noted. 4. Atrial Fibrillation:  Maintaining NSR.  He has declined coumadin in the past. 5. Chronic Kidney Disease:  Follow up BMET today.   6. Disposition:  Follow up with Dr. Johney Hess 4-6 weeks or sooner PRN.   Signed, Tereso Newcomer, PA-C  11:11 AM 05/25/2012

## 2012-05-25 NOTE — Patient Instructions (Addendum)
LAB TODAY; BMET  YOU HAVE A FOLLOW UP APPT WITH DR. ALLRED ON 07/05/12 @ 3:15  LIMIT FLUID INTAKE TO NO MORE THAN 60 OZ DAILY  WEIGH DAILY AND CALL [phone] SCOTT WEAVER, PAC IF WEIGHT IS INCREASED BY 2-3 LB'S X 1 DAY OR MORE SHORT OF BREATH OR SWELLING

## 2012-05-28 ENCOUNTER — Telehealth: Payer: Self-pay | Admitting: *Deleted

## 2012-05-28 NOTE — Telephone Encounter (Signed)
pt's wife notified about lab results with verbal understanding. wife also states pt has the GI bug and has been vomiting. said she was going to give him some gatorade and soup and get his medications in him. I advised they need to call PCP, wife said ok

## 2012-05-28 NOTE — Telephone Encounter (Signed)
Message copied by Tarri Fuller on Mon May 28, 2012  9:12 AM ------      Message from: [place], [place] T      Created: Sun May 27, 2012  7:59 PM       Renal fxn stable.  K+ ok.      Continue with current treatment plan.      Tereso Newcomer, PA-C  7:59 PM 05/27/2012 ------

## 2012-05-28 NOTE — Telephone Encounter (Signed)
#  busy, will try again later.  

## 2012-05-29 ENCOUNTER — Encounter: Payer: Self-pay | Admitting: *Deleted

## 2012-06-05 ENCOUNTER — Encounter: Payer: Medicare Other | Admitting: *Deleted

## 2012-06-25 ENCOUNTER — Encounter: Payer: Self-pay | Admitting: Internal Medicine

## 2012-06-26 ENCOUNTER — Other Ambulatory Visit: Payer: Self-pay

## 2012-06-26 DIAGNOSIS — I5022 Chronic systolic (congestive) heart failure: Secondary | ICD-10-CM

## 2012-06-26 MED ORDER — FUROSEMIDE 20 MG PO TABS: 20.0000 mg | ORAL_TABLET | Freq: Every day | ORAL | Status: DC

## 2012-07-05 ENCOUNTER — Encounter: Payer: Medicare Other | Admitting: Internal Medicine

## 2012-07-09 ENCOUNTER — Encounter: Payer: Self-pay | Admitting: Internal Medicine

## 2012-07-09 ENCOUNTER — Ambulatory Visit (INDEPENDENT_AMBULATORY_CARE_PROVIDER_SITE_OTHER): Payer: Medicare Other | Admitting: Internal Medicine

## 2012-07-09 VITALS — BP 126/78 | HR 76 | Ht 69.0 in | Wt 137.1 lb

## 2012-07-09 DIAGNOSIS — Z950 Presence of cardiac pacemaker: Secondary | ICD-10-CM

## 2012-07-09 DIAGNOSIS — R0789 Other chest pain: Secondary | ICD-10-CM | POA: Insufficient documentation

## 2012-07-09 DIAGNOSIS — I4891 Unspecified atrial fibrillation: Secondary | ICD-10-CM

## 2012-07-09 DIAGNOSIS — I495 Sick sinus syndrome: Secondary | ICD-10-CM

## 2012-07-09 LAB — PACEMAKER DEVICE OBSERVATION
AL THRESHOLD: 1 V
ATRIAL PACING PM: 62.1
BAMS-0001: 170 {beats}/min
RV LEAD AMPLITUDE: 10.8 mv

## 2012-07-09 MED ORDER — PANTOPRAZOLE SODIUM 40 MG PO TBEC: 40.0000 mg | DELAYED_RELEASE_TABLET | Freq: Every day | ORAL | Status: DC

## 2012-07-09 NOTE — Progress Notes (Signed)
PCP: [name], MD  Mark Hess is a 79 y.o. male who presents today for routine electrophysiology followup.  Since last being seen in our clinic, the patient reports doing very well.  His primary concern is with lower back pain.  He follows with Dr Valentina Lucks for this.  He also reports that he has improved chest pain.  He continues to note chest pain occasionally at night.  Today, he denies symptoms of palpitations, exertional chest pain, shortness of breath,  lower extremity edema, dizziness, presyncope, or syncope.  The patient is otherwise without complaint today.   Past Medical History  Diagnosis Date  . Chronic systolic heart failure     a. EF 30 to 35% per echo in Jan 2011; b. Echo 3/14:  mild LVH, EF 20-25%, diff HK, mod MR, mod LAE, PASP 48  . Coronary artery disease     Mild CAD per cath in 2009  . Atrial fibrillation     maintained in sinus on amiodarone  . Nonsustained ventricular tachycardia   . Alcohol abuse   . Diabetes mellitus   . Hypertension   . Tobacco abuse     quit  . COPD (chronic obstructive pulmonary disease)   . GERD (gastroesophageal reflux disease)   . Gout   . Dizziness     Chronic   Past Surgical History  Procedure Laterality Date  . Transthoracic echocardiogram  03/2009    SHOWED EF IN THE 30 to 35% RANGE  . Cardiac catheterization  06/26/2007    THERE WAS GLOBAL HYPOKINESUS AND EF 15-20%. SHOWED MILD CORONARY DISEASE WITH A 50% DISTAL RIGHT CORONARY IN 20-30% SCATTERED NARROWINGS IN THE LEFT CIRCUMFLEX. HE DID HAVE GLOBAL LV DYSFUNCTION AT THAT TIME  . Insert / replace / remove pacemaker  08/2007  . Cholecystectomy    . Cardioversion    . Cardiovascular stress test  03/2009    Current Outpatient Prescriptions  Medication Sig Dispense Refill  . allopurinol (ZYLOPRIM) 300 MG tablet Take 1 tablet (300 mg total) by mouth daily.  30 tablet  11  . amiodarone (PACERONE) 200 MG tablet Take 0.5 tablets (100 mg total) by mouth daily.      Marland Kitchen aspirin  81 MG tablet Take 81 mg by mouth daily.        . carvedilol (COREG) 3.125 MG tablet Take 3.125 mg by mouth daily.      . CYCLOBENZAPRINE HCL PO Take 10 mg by mouth at bedtime as needed.      . furosemide (LASIX) 20 MG tablet Take 1 tablet (20 mg total) by mouth daily.  30 tablet  5  . isosorbide mononitrate (IMDUR) 30 MG 24 hr tablet Take 0.5 tablets (15 mg total) by mouth daily.  30 tablet  3  . nitroGLYCERIN (NITROSTAT) 0.4 MG SL tablet Place 1 tablet (0.4 mg total) under the tongue every 5 (five) minutes as needed for chest pain.  25 tablet  3   No current facility-administered medications for this visit.    Physical Exam: Filed Vitals:   07/09/12 0957  BP: 126/78  Pulse: 76  Height: 5\' 9"  (1.753 m)  Weight: 137 lb 1.9 oz (62.197 kg)    GEN- The patient is well appearing, alert and oriented x 3 today.   Head- normocephalic, atraumatic Eyes-  Sclera clear, conjunctiva pink Ears- hearing intact Oropharynx- clear Lungs- Clear to ausculation bilaterally, normal work of breathing Chest- pacemaker pocket is well healed Heart- Regular rate and rhythm, no murmurs, rubs or  gallops, PMI not laterally displaced GI- soft, NT, ND, + BS Extremities- no clubbing, cyanosis, or edema  Pacemaker interrogation- reviewed in detail today,  See PACEART report  Assessment and Plan:  1. Bradycardia Normal pacemaker function See Pace Art report No changes today  2. afib Well controlled with amiodarone He will contemplate coumadin Given renal failure and advanced age, I think that he is too high risk for a novel anticoagulant at this time  3. Atypical chest pain Possibly GERD He wishes to avoid further CV testing at this time Add protonix  Follow-up with Tereso Newcomer in 2 months I will see in 12 months

## 2012-07-09 NOTE — Patient Instructions (Addendum)
Your physician recommends that you schedule a follow-up appointment in: 2 months with Mark Hess and 12 months with Dr Johney Frame  Remote monitoring is used to monitor your Pacemaker or ICD from home. This monitoring reduces the number of office visits required to check your device to one time per year. It allows [place] to keep an eye on the functioning of your device to ensure it is working properly. You are scheduled for a device check from home on 10/08/12. You may send your transmission at any time that day. If you have a wireless device, the transmission will be sent automatically. After your physician reviews your transmission, you will receive a postcard with your next transmission date.   Your physician has recommended you make the following change in your medication:  1) Start Protonix 40mg  daily

## 2012-08-07 ENCOUNTER — Telehealth: Payer: Self-pay | Admitting: Internal Medicine

## 2012-08-07 NOTE — Telephone Encounter (Signed)
New problem   Wife did a transmitting today.    C/O not feeling well for a couple of day. Sob.

## 2012-08-09 ENCOUNTER — Other Ambulatory Visit: Payer: Self-pay | Admitting: Dermatology

## 2012-08-21 ENCOUNTER — Telehealth: Payer: Self-pay | Admitting: Nurse Practitioner

## 2012-08-21 NOTE — Telephone Encounter (Signed)
New Problem:    Patient's wife called in wanting her husband to be seen tomorrow because of his lethargy.  States that he is not himself. Please call back.

## 2012-08-21 NOTE — Telephone Encounter (Addendum)
Will add on to Dr Allred's schedule for tomorrow  Pt's wife aware

## 2012-08-22 ENCOUNTER — Encounter: Payer: Self-pay | Admitting: Internal Medicine

## 2012-08-22 ENCOUNTER — Ambulatory Visit (INDEPENDENT_AMBULATORY_CARE_PROVIDER_SITE_OTHER): Payer: Medicare Other | Admitting: Internal Medicine

## 2012-08-22 ENCOUNTER — Encounter: Payer: Self-pay | Admitting: *Deleted

## 2012-08-22 VITALS — BP 118/81 | HR 64 | Ht 69.0 in | Wt 140.8 lb

## 2012-08-22 DIAGNOSIS — I4891 Unspecified atrial fibrillation: Secondary | ICD-10-CM

## 2012-08-22 DIAGNOSIS — I495 Sick sinus syndrome: Secondary | ICD-10-CM

## 2012-08-22 LAB — BASIC METABOLIC PANEL
Calcium: 9.7 mg/dL (ref 8.4–10.5)
GFR: 33.04 mL/min — ABNORMAL LOW (ref >60.00)
Glucose, Bld: 113 mg/dL — ABNORMAL HIGH (ref 70–99)
Sodium: 142 mEq/L (ref 135–145)

## 2012-08-22 LAB — PACEMAKER DEVICE OBSERVATION
AL AMPLITUDE: 0.3 mv
AL IMPEDENCE PM: 416 Ohm
BAMS-0001: 170 {beats}/min
RV LEAD AMPLITUDE: 10.8 mv
RV LEAD IMPEDENCE PM: 360 Ohm
RV LEAD THRESHOLD: 1 V

## 2012-08-22 LAB — CBC WITH DIFFERENTIAL/PLATELET
Basophils Absolute: 0 10*3/uL (ref 0.0–0.1)
Hemoglobin: 11.9 g/dL — ABNORMAL LOW (ref 13.0–17.0)
Lymphocytes Relative: 16.8 % (ref 12.0–46.0)
Monocytes Relative: 9.9 % (ref 3.0–12.0)
Neutro Abs: 6.3 10*3/uL (ref 1.4–7.7)
RDW: 17.9 % — ABNORMAL HIGH (ref 11.5–14.6)

## 2012-08-22 MED ORDER — APIXABAN 2.5 MG PO TABS: 2.5000 mg | ORAL_TABLET | Freq: Two times a day (BID) | ORAL | Status: DC

## 2012-08-22 MED ORDER — AMIODARONE HCL 200 MG PO TABS: ORAL_TABLET | ORAL | Status: DC

## 2012-08-22 NOTE — Patient Instructions (Addendum)
Your physician recommends that you schedule a follow-up appointment in: 4-6 weeks with Tereso Newcomer, PA  Your physician has recommended you make the following change in your medication:  1) Stop Aspirin 2) Start Eliquis 2.5mg  twice daily 3) Increase Amiodarone 200mg  twice daily for 7 days then decrease back to 200mg  daily   Your physician has requested that you have a TEE/Cardioversion. During a TEE, sound waves are used to create images of your heart. It provides your doctor with information about the size and shape of your heart and how well your heart's chambers and valves are working. In this test, a transducer is attached to the end of a flexible tube that is guided down you throat and into your esophagus (the tube leading from your mouth to your stomach) to get a more detailed image of your heart. Once the TEE has determined that a blood clot is not present, the cardioversion begins. Electrical Cardioversion uses a jolt of electricity to your heart either through paddles or wired patches attached to your chest. This is a controlled, usually prescheduled, procedure. This procedure is done at the hospital and you are not awake during the procedure. You usually go home the day of the procedure. Please see the instruction sheet given to you today for more information.

## 2012-08-22 NOTE — Progress Notes (Signed)
PCP: [name], MD  Mark Hess is a 79 y.o. male who presents today for routine electrophysiology followup.  The patient reports progressive decline in activity level over the past few weeks.  This temporally corresponds to afib return.  He reports fatigue and decreased exercise tolerance.   Today, he denies symptoms of palpitations, exertional chest pain, shortness of breath,  lower extremity edema, dizziness, presyncope, or syncope.  The patient is otherwise without complaint today.   Past Medical History  Diagnosis Date  . Chronic systolic heart failure     a. EF 30 to 35% per echo in Jan 2011; b. Echo 3/14:  mild LVH, EF 20-25%, diff HK, mod MR, mod LAE, PASP 48  . Coronary artery disease     Mild CAD per cath in 2009  . Atrial fibrillation     maintained in sinus on amiodarone  . Nonsustained ventricular tachycardia   . Alcohol abuse   . Diabetes mellitus   . Hypertension   . Tobacco abuse     quit  . COPD (chronic obstructive pulmonary disease)   . GERD (gastroesophageal reflux disease)   . Gout   . Dizziness     Chronic   Past Surgical History  Procedure Laterality Date  . Transthoracic echocardiogram  03/2009    SHOWED EF IN THE 30 to 35% RANGE  . Cardiac catheterization  06/26/2007    THERE WAS GLOBAL HYPOKINESUS AND EF 15-20%. SHOWED MILD CORONARY DISEASE WITH A 50% DISTAL RIGHT CORONARY IN 20-30% SCATTERED NARROWINGS IN THE LEFT CIRCUMFLEX. HE DID HAVE GLOBAL LV DYSFUNCTION AT THAT TIME  . Insert / replace / remove pacemaker  08/2007  . Cholecystectomy    . Cardioversion    . Cardiovascular stress test  03/2009    Current Outpatient Prescriptions  Medication Sig Dispense Refill  . allopurinol (ZYLOPRIM) 300 MG tablet Take 1 tablet (300 mg total) by mouth daily.  30 tablet  11  . amiodarone (PACERONE) 200 MG tablet Take 0.5 tablets (100 mg total) by mouth daily.      Marland Kitchen aspirin 81 MG tablet Take 81 mg by mouth daily.        . carvedilol (COREG) 3.125 MG  tablet Take 3.125 mg by mouth daily.      . CYCLOBENZAPRINE HCL PO Take 10 mg by mouth at bedtime as needed.      . furosemide (LASIX) 20 MG tablet Take 1 tablet (20 mg total) by mouth daily.  30 tablet  5  . isosorbide mononitrate (IMDUR) 30 MG 24 hr tablet Take 0.5 tablets (15 mg total) by mouth daily.  30 tablet  3  . nitroGLYCERIN (NITROSTAT) 0.4 MG SL tablet Place 1 tablet (0.4 mg total) under the tongue every 5 (five) minutes as needed for chest pain.  25 tablet  3  . pantoprazole (PROTONIX) 40 MG tablet Take 1 tablet (40 mg total) by mouth daily.  90 tablet  3  . predniSONE (DELTASONE) 5 MG tablet Take 5 mg by mouth daily.       No current facility-administered medications for this visit.    Physical Exam: Filed Vitals:   08/22/12 1010  BP: 118/81  Pulse: 64  Height: 5\' 9"  (1.753 m)  Weight: 140 lb 12.8 oz (63.866 kg)    GEN- The patient is well appearing, alert and oriented x 3 today.   Head- normocephalic, atraumatic Eyes-  Sclera clear, conjunctiva pink Ears- hearing intact Oropharynx- clear Lungs- Clear to ausculation bilaterally, normal work  of breathing Chest- pacemaker pocket is well healed Heart- irregular rate and rhythm, no murmurs, rubs or gallops, PMI not laterally displaced GI- soft, NT, ND, + BS Extremities- no clubbing, cyanosis, or edema  Pacemaker interrogation- reviewed in detail today,  See PACEART report  Assessment and Plan:  1. Bradycardia Normal pacemaker function See Pace Art report No changes today  2. afib Back in afib Increase amiodarone to 200mg  BID x 7 days then 200mg  daily thereafter Given recent data with eliquis, I think that this is an safe alternative to warfarin Start eliquis 2.5mg  BID today Stop ASA Risks, benefits, and alternatives to TEE guided cardioversion were discussed with the patient today who wishes to proceed.  Will schedule TEE guided cardioversion early next week.  Return to see Tereso Newcomer in 4-6 weeks

## 2012-08-23 ENCOUNTER — Telehealth: Payer: Self-pay | Admitting: *Deleted

## 2012-08-23 NOTE — Telephone Encounter (Signed)
TEE/guided cardioversion set up for 6/20 930am arrival for a 11am procedure at [facility]. [facility]. Dr.Crenshaw to do procedure. Case #[id number]. Left message for patient to call back to confirm.

## 2012-08-24 NOTE — Telephone Encounter (Signed)
Wife aware of instructions and date and time.  She had no further questions or concerns

## 2012-08-25 ENCOUNTER — Encounter (HOSPITAL_COMMUNITY): Payer: Self-pay | Admitting: *Deleted

## 2012-08-25 ENCOUNTER — Inpatient Hospital Stay (HOSPITAL_COMMUNITY)
Admission: EM | Admit: 2012-08-25 | Discharge: 2012-08-28 | DRG: 292 | Disposition: A | Discharge status: Home Health | Payer: Medicare Other | Attending: Cardiology | Admitting: Cardiology

## 2012-08-25 ENCOUNTER — Telehealth: Payer: Self-pay | Admitting: Nurse Practitioner

## 2012-08-25 ENCOUNTER — Emergency Department (HOSPITAL_COMMUNITY): Payer: Medicare Other

## 2012-08-25 DIAGNOSIS — R079 Chest pain, unspecified: Secondary | ICD-10-CM

## 2012-08-25 DIAGNOSIS — I4729 Other ventricular tachycardia: Secondary | ICD-10-CM | POA: Diagnosis not present

## 2012-08-25 DIAGNOSIS — M109 Gout, unspecified: Secondary | ICD-10-CM | POA: Diagnosis present

## 2012-08-25 DIAGNOSIS — N179 Acute kidney failure, unspecified: Secondary | ICD-10-CM | POA: Diagnosis present

## 2012-08-25 DIAGNOSIS — F1010 Alcohol abuse, uncomplicated: Secondary | ICD-10-CM | POA: Diagnosis present

## 2012-08-25 DIAGNOSIS — J449 Chronic obstructive pulmonary disease, unspecified: Secondary | ICD-10-CM | POA: Diagnosis present

## 2012-08-25 DIAGNOSIS — I472 Ventricular tachycardia, unspecified: Secondary | ICD-10-CM | POA: Diagnosis not present

## 2012-08-25 DIAGNOSIS — K219 Gastro-esophageal reflux disease without esophagitis: Secondary | ICD-10-CM | POA: Diagnosis present

## 2012-08-25 DIAGNOSIS — Z87891 Personal history of nicotine dependence: Secondary | ICD-10-CM

## 2012-08-25 DIAGNOSIS — E119 Type 2 diabetes mellitus without complications: Secondary | ICD-10-CM | POA: Diagnosis present

## 2012-08-25 DIAGNOSIS — I5023 Acute on chronic systolic (congestive) heart failure: Principal | ICD-10-CM | POA: Diagnosis not present

## 2012-08-25 DIAGNOSIS — I509 Heart failure, unspecified: Secondary | ICD-10-CM | POA: Diagnosis present

## 2012-08-25 DIAGNOSIS — I4891 Unspecified atrial fibrillation: Secondary | ICD-10-CM | POA: Diagnosis present

## 2012-08-25 DIAGNOSIS — I2510 Atherosclerotic heart disease of native coronary artery without angina pectoris: Secondary | ICD-10-CM | POA: Diagnosis present

## 2012-08-25 DIAGNOSIS — I5022 Chronic systolic (congestive) heart failure: Secondary | ICD-10-CM

## 2012-08-25 DIAGNOSIS — I428 Other cardiomyopathies: Secondary | ICD-10-CM | POA: Diagnosis present

## 2012-08-25 DIAGNOSIS — J4489 Other specified chronic obstructive pulmonary disease: Secondary | ICD-10-CM | POA: Diagnosis present

## 2012-08-25 DIAGNOSIS — N189 Chronic kidney disease, unspecified: Secondary | ICD-10-CM | POA: Diagnosis present

## 2012-08-25 DIAGNOSIS — I129 Hypertensive chronic kidney disease with stage 1 through stage 4 chronic kidney disease, or unspecified chronic kidney disease: Secondary | ICD-10-CM | POA: Diagnosis present

## 2012-08-25 DIAGNOSIS — Z950 Presence of cardiac pacemaker: Secondary | ICD-10-CM

## 2012-08-25 HISTORY — DX: Bradycardia, unspecified: R00.1

## 2012-08-25 HISTORY — DX: Other cardiomyopathies: I42.8

## 2012-08-25 HISTORY — DX: Paroxysmal atrial fibrillation: I48.0

## 2012-08-25 HISTORY — DX: Chronic kidney disease, unspecified: N18.9

## 2012-08-25 LAB — COMPREHENSIVE METABOLIC PANEL
BUN: 45 mg/dL — ABNORMAL HIGH (ref 6–23)
CO2: 20 mEq/L (ref 19–32)
Chloride: 109 mEq/L (ref 96–112)
Creatinine, Ser: 2.63 mg/dL — ABNORMAL HIGH (ref 0.50–1.35)
GFR calc non Af Amer: 20 mL/min — ABNORMAL LOW (ref >90)
Glucose, Bld: 128 mg/dL — ABNORMAL HIGH (ref 70–99)
Total Bilirubin: 0.4 mg/dL (ref 0.3–1.2)

## 2012-08-25 LAB — URINALYSIS, ROUTINE W REFLEX MICROSCOPIC
Glucose, UA: NEGATIVE mg/dL
Hgb urine dipstick: NEGATIVE
Ketones, ur: NEGATIVE mg/dL
Protein, ur: NEGATIVE mg/dL
pH: 5 (ref 5.0–8.0)

## 2012-08-25 LAB — CBC WITH DIFFERENTIAL/PLATELET
HCT: 35.5 % — ABNORMAL LOW (ref 39.0–52.0)
Hemoglobin: 11.4 g/dL — ABNORMAL LOW (ref 13.0–17.0)
Lymphocytes Relative: 27 % (ref 12–46)
Lymphs Abs: 2.4 10*3/uL (ref 0.7–4.0)
MCHC: 32.1 g/dL (ref 30.0–36.0)
Monocytes Absolute: 1 10*3/uL (ref 0.1–1.0)
Monocytes Relative: 11 % (ref 3–12)
Neutro Abs: 5.4 10*3/uL (ref 1.7–7.7)
WBC: 9.1 10*3/uL (ref 4.0–10.5)

## 2012-08-25 LAB — PROTIME-INR: Prothrombin Time: 16 seconds — ABNORMAL HIGH (ref 11.6–15.2)

## 2012-08-25 LAB — TROPONIN I: Troponin I: <0.3 ng/mL (ref <0.30)

## 2012-08-25 LAB — TSH: TSH: 2.901 u[IU]/mL (ref 0.350–4.500)

## 2012-08-25 MED ORDER — FUROSEMIDE 10 MG/ML IJ SOLN
40.0000 mg | Freq: Three times a day (TID) | INTRAMUSCULAR | Status: DC
  Administered 2012-08-25 (×2): 40 mg via INTRAVENOUS
  Filled 2012-08-25 (×5): qty 4

## 2012-08-25 MED ORDER — PREDNISONE 5 MG PO TABS
5.0000 mg | ORAL_TABLET | Freq: Every day | ORAL | Status: DC
  Administered 2012-08-25 – 2012-08-28 (×4): 5 mg via ORAL
  Filled 2012-08-25 (×4): qty 1

## 2012-08-25 MED ORDER — ASPIRIN EC 325 MG PO TBEC
325.0000 mg | DELAYED_RELEASE_TABLET | Freq: Once | ORAL | Status: AC
  Administered 2012-08-25: 325 mg via ORAL
  Filled 2012-08-25: qty 1

## 2012-08-25 MED ORDER — CARVEDILOL 3.125 MG PO TABS
3.1250 mg | ORAL_TABLET | Freq: Every day | ORAL | Status: DC
  Administered 2012-08-26 – 2012-08-28 (×3): 3.125 mg via ORAL
  Filled 2012-08-25 (×3): qty 1

## 2012-08-25 MED ORDER — ALLOPURINOL 300 MG PO TABS
300.0000 mg | ORAL_TABLET | Freq: Every day | ORAL | Status: DC
  Administered 2012-08-26 – 2012-08-27 (×2): 300 mg via ORAL
  Filled 2012-08-25 (×3): qty 1

## 2012-08-25 MED ORDER — SODIUM CHLORIDE 0.9 % IV SOLN
INTRAVENOUS | Status: DC
  Administered 2012-08-25: 22:00:00 via INTRAVENOUS

## 2012-08-25 MED ORDER — SODIUM CHLORIDE 0.9 % IJ SOLN: 3.0000 mL | INTRAMUSCULAR | Status: DC | PRN

## 2012-08-25 MED ORDER — NITROGLYCERIN 0.4 MG SL SUBL: 0.4000 mg | SUBLINGUAL_TABLET | SUBLINGUAL | Status: DC | PRN

## 2012-08-25 MED ORDER — SODIUM CHLORIDE 0.9 % IV SOLN: 250.0000 mL | INTRAVENOUS | Status: DC | PRN

## 2012-08-25 MED ORDER — ISOSORBIDE MONONITRATE 15 MG HALF TABLET
15.0000 mg | ORAL_TABLET | Freq: Every day | ORAL | Status: DC
  Administered 2012-08-26 – 2012-08-27 (×2): 15 mg via ORAL
  Filled 2012-08-25 (×3): qty 1

## 2012-08-25 MED ORDER — CYCLOBENZAPRINE HCL 10 MG PO TABS: 10.0000 mg | ORAL_TABLET | Freq: Every evening | ORAL | Status: DC | PRN

## 2012-08-25 MED ORDER — SODIUM CHLORIDE 0.9 % IJ SOLN
3.0000 mL | Freq: Two times a day (BID) | INTRAMUSCULAR | Status: DC
  Administered 2012-08-25 – 2012-08-28 (×6): 3 mL via INTRAVENOUS

## 2012-08-25 MED ORDER — PANTOPRAZOLE SODIUM 40 MG PO TBEC
40.0000 mg | DELAYED_RELEASE_TABLET | Freq: Every day | ORAL | Status: DC
  Administered 2012-08-25 – 2012-08-28 (×4): 40 mg via ORAL
  Filled 2012-08-25 (×4): qty 1

## 2012-08-25 MED ORDER — FUROSEMIDE 10 MG/ML IJ SOLN
40.0000 mg | Freq: Once | INTRAMUSCULAR | Status: AC
  Administered 2012-08-25: 40 mg via INTRAVENOUS
  Filled 2012-08-25: qty 4

## 2012-08-25 MED ORDER — HYDRALAZINE HCL 10 MG PO TABS
10.0000 mg | ORAL_TABLET | Freq: Three times a day (TID) | ORAL | Status: DC
  Administered 2012-08-25 – 2012-08-27 (×5): 10 mg via ORAL
  Filled 2012-08-25 (×11): qty 1

## 2012-08-25 MED ORDER — APIXABAN 2.5 MG PO TABS
2.5000 mg | ORAL_TABLET | Freq: Once | ORAL | Status: AC
  Administered 2012-08-25: 2.5 mg via ORAL
  Filled 2012-08-25: qty 1

## 2012-08-25 MED ORDER — ONDANSETRON HCL 4 MG/2ML IJ SOLN: 4.0000 mg | Freq: Four times a day (QID) | INTRAMUSCULAR | Status: DC | PRN

## 2012-08-25 MED ORDER — AMIODARONE HCL 200 MG PO TABS
200.0000 mg | ORAL_TABLET | Freq: Two times a day (BID) | ORAL | Status: DC
  Administered 2012-08-25 – 2012-08-28 (×6): 200 mg via ORAL
  Filled 2012-08-25 (×7): qty 1

## 2012-08-25 MED ORDER — ACETAMINOPHEN 325 MG PO TABS
650.0000 mg | ORAL_TABLET | ORAL | Status: DC | PRN
  Administered 2012-08-26 – 2012-08-27 (×5): 650 mg via ORAL
  Filled 2012-08-25 (×5): qty 2

## 2012-08-25 MED ORDER — APIXABAN 2.5 MG PO TABS
2.5000 mg | ORAL_TABLET | Freq: Two times a day (BID) | ORAL | Status: DC
  Administered 2012-08-25 – 2012-08-28 (×6): 2.5 mg via ORAL
  Filled 2012-08-25 (×7): qty 1

## 2012-08-25 NOTE — ED Notes (Signed)
MD at bedside. 

## 2012-08-25 NOTE — ED Notes (Signed)
Per EMS pt from home with c/o shortness of breath since Wednesday, saw cardiologist Wednesday and given medication for pre-procedure for cardioversion this Friday due to A-Fib. Pt has pacemaker. O2 sats 98% on RA. No wheezing noted. BP 138/97. HR 75.

## 2012-08-25 NOTE — H&P (Signed)
Patient ID: Mark Hess MRN: [id number], DOB/AGE: [birth year]   Admit date: 08/25/2012  Primary Physician: [name], MD Primary Cardiologist: Shela Commons. Allred, MD   Pt. Profile:  79 y/o male with h/o NICM/CHF and PAF on amio and eliquis with plan for outpt dccv 6/20 who presented to the ED this AM with dyspnea and chf.  Problem List  Past Medical History  Diagnosis Date  . Chronic systolic heart failure     a. EF 30 to 35% per echo in Jan 2011; b. Echo 3/14:  mild LVH, EF 20-25%, diff HK, mod MR, mod LAE, PASP 48  . Coronary artery disease     Mild CAD per cath in 2009  . Atrial fibrillation     a. on amio w/ recurrent afib 08/2012;  b. eliquis started 08/2012.  . Nonsustained ventricular tachycardia   . Alcohol abuse   . Diabetes mellitus   . Hypertension   . Tobacco abuse     quit  . COPD (chronic obstructive pulmonary disease)   . GERD (gastroesophageal reflux disease)   . Gout   . Dizziness     Chronic  . Nonischemic cardiomyopathy     Past Surgical History  Procedure Laterality Date  . Transthoracic echocardiogram  03/2009    SHOWED EF IN THE 30 to 35% RANGE  . Cardiac catheterization  06/26/2007    THERE WAS GLOBAL HYPOKINESUS AND EF 15-20%. SHOWED MILD CORONARY DISEASE WITH A 50% DISTAL RIGHT CORONARY IN 20-30% SCATTERED NARROWINGS IN THE LEFT CIRCUMFLEX. HE DID HAVE GLOBAL LV DYSFUNCTION AT THAT TIME  . Insert / replace / remove pacemaker  08/2007  . Cholecystectomy    . Cardioversion    . Cardiovascular stress test  03/2009     Allergies  No Known Allergies  HPI  79 year old male with history of nonischemic cardiomyopathy and chronic systolic CHF with an EF of 20-25% by most recent echocardiogram in March of 2014. Patient was recently seen in clinic on June 11 by Dr. Johney Frame and complained of progressive fatigue and reduced exercise tolerance. He was found to be in a rate-controlled atrial fibrillation which was likely contributing to symptoms. Patient  had not previously been anticoagulated and therefore eliquis 2.5 mg twice a day was initiated while his home amiodarone dose was increased 200 mg twice a day. Following this appointment, patient unfortunately began to experience worsening dyspnea as well as orthopnea. His appetite also worsened and he noted nausea surrounding his meals. He thought that perhaps he was having a reaction his eliquis.  He noted that his weight jumped from 132 pounds to 140 pounds.  Last night was a rough night and so his family brought him to the ED this morning.  Here, he is markedly volume overloaded and has been treated with Lasix. His chest x-ray shows pulmonary edema while his proBNP is elevated greater than 30,000.  We've been asked to eval.    Home Medications  Prior to Admission medications   Medication Sig Start Date End Date Taking? Authorizing Provider  allopurinol (ZYLOPRIM) 300 MG tablet Take 1 tablet (300 mg total) by mouth daily. 03/02/12  Yes Rosalio Macadamia, NP  amiodarone (PACERONE) 200 MG tablet Take twice daily for one week then decrease to once daily 08/22/12  Yes Hillis Range, MD  apixaban (ELIQUIS) 2.5 MG TABS tablet Take 1 tablet (2.5 mg total) by mouth 2 (two) times daily. 08/22/12  Yes Hillis Range, MD  aspirin 325 MG tablet Take 325 mg by  mouth daily as needed for pain.   Yes Historical Provider, MD  carvedilol (COREG) 3.125 MG tablet Take 3.125 mg by mouth daily. 05/09/12  Yes Beatrice Lecher, PA-C  CYCLOBENZAPRINE HCL PO Take 10 mg by mouth at bedtime as needed.   Yes Historical Provider, MD  furosemide (LASIX) 20 MG tablet Take 1 tablet (20 mg total) by mouth daily. 06/26/12  Yes Hillis Range, MD  isosorbide mononitrate (IMDUR) 30 MG 24 hr tablet Take 0.5 tablets (15 mg total) by mouth daily. 05/09/12  Yes Beatrice Lecher, PA-C  nitroGLYCERIN (NITROSTAT) 0.4 MG SL tablet Place 1 tablet (0.4 mg total) under the tongue every 5 (five) minutes as needed for chest pain. 05/09/12  Yes Scott T Alben Spittle, PA-C    pantoprazole (PROTONIX) 40 MG tablet Take 1 tablet (40 mg total) by mouth daily. 07/09/12  Yes Hillis Range, MD  predniSONE (DELTASONE) 5 MG tablet Take 5 mg by mouth daily.   Yes Historical Provider, MD    Family History  Family History  Problem Relation Age of Onset  . Heart attack Brother   . Atrial fibrillation Son   . Arrhythmia Son     Social History  History   Social History  . Marital Status: Married    Spouse Name: N/A    Number of Children: N/A  . Years of Education: N/A   Occupational History  . Not on file.   Social History Main Topics  . Smoking status: Former Smoker    Quit date: 05/13/2007  . Smokeless tobacco: Current User    Types: Chew  . Alcohol Use: No  . Drug Use: No  . Sexually Active: Not on file   Other Topics Concern  . Not on file   Social History Narrative   Lives in [place] with wife.  Children near by.     Review of Systems All other systems reviewed and are otherwise negative except as noted in HPI.  Physical Exam  Blood pressure 125/75, pulse 66, temperature 97.7 F (36.5 C), temperature source Oral, resp. rate 21, SpO2 97.00%.  General: Pleasant, NAD Psych: Normal affect. Neuro: Alert and oriented X 3. Moves all extremities spontaneously. HEENT: Normal  Neck: Supple without bruits, JVP 12-13 cm. Lungs:  Crackles at bases bilaterally.  Heart: irregular rhythm no s3, s4, or murmurs. Abdomen: Soft, non-tender, non-distended, BS + x 4.  Extremities: No clubbing, cyanosis.  1+ ankle edema.   Labs   Recent Labs  08/25/12 0815  TROPONINI <0.30   Lab Results  Component Value Date   WBC 9.1 08/25/2012   HGB 11.4* 08/25/2012   HCT 35.5* 08/25/2012   MCV 84.5 08/25/2012   PLT 205 08/25/2012     Recent Labs Lab 08/25/12 0815  NA 141  K 4.3  CL 109  CO2 20  BUN 45*  CREATININE 2.63*  CALCIUM 9.4  PROT 6.5  BILITOT 0.4  ALKPHOS 80  ALT 23  AST 22  GLUCOSE 128*   Lab Results  Component Value Date   CHOL  130 08/11/2010   HDL 49.20 08/11/2010   LDLCALC 66 08/11/2010   TRIG 72.0 08/11/2010    Radiology/Studies  Dg Chest Portable 1 View  08/25/2012   *RADIOLOGY REPORT*  Clinical Data: Short of breath  PORTABLE CHEST - 1 VIEW  Comparison: Prior chest x-ray 05/09/2012  Findings: Stable position of right subclavian approach cardiac rhythm maintenance device with leads projecting over the right atrium and right ventricle.  Slightly increased  pulmonary vascular congestion now with mild interstitial edema.  Small right pleural effusion and associated right basilar opacity.  Slightly increased enlargement the cardiopericardial silhouette.  Atherosclerotic calcifications again noted the transverse aorta.  No acute osseous abnormality on this single view.  No pneumothorax.  IMPRESSION:  Mild CHF with increased prominence of the cardiac silhouette, mild interstitial edema and small right pleural effusion with associated right basilar atelectasis.   Original Report Authenticated By: Malachy Moan, M.D.    ECG: atrial fibrillation with HR 80, LBBB QRS 120 msec  ASSESSMENT AND PLAN 79 yo with history of nonischemic cardiomyopathy, chronic atrial fibrillation, and CKD was admitted today with volume overload/dyspnea.  1. CHF: Acute on chronic systolic CHF, EF 16-10% last echo.  He is very volume overloaded on exam.  It is possible that this exacerbation was triggered by going back into atrial fibrillation.   - Lasix 40 mg IV every 8 hours - hydralazine 10 mg every 8 hrs + Imdur 30.  - Continue low dose Coreg - No ACEI with elevated creatinine. 2. Atrial fibrillation: Rate is controlled.  Possible cause of exacerbation.   - Continue Coreg and apixaban (dosed 2.5 mg bid).  - TEE-guided cardioversion on Monday.  3. AKI on CKD: Creatinine 2.6 today, baseline around 2.  Hopefully creatinine will improve as renal venous pressure is lowered with diuresis.  Marca Ancona 08/25/2012 2:05 PM

## 2012-08-25 NOTE — Telephone Encounter (Signed)
Pts wife called this AM stating that she is concerned that patient may have had an allergic reaction to newly initiated eliquis.  He's been experiencing nausea for the past day or 2 as well as progressive lower extremity edema, dyspnea, and orthopnea. He does have a prior history of heart failure. I suggested that this sounded more consistent with heart failure as opposed to an allergic reaction to it she then said that patient was taken to the ED by EMS this morning and is in the ED now. I advised that we have not yet been called by the ED providers but presume that we will be at which point patient will likely be admitted and treated for his symptoms. She verbalized understanding and was grateful for the call back.

## 2012-08-25 NOTE — ED Provider Notes (Signed)
I saw and evaluated the patient, reviewed the resident's note and I agree with the findings and plan. If applicable, I agree with the resident's interpretation of the EKG.  If applicable, I was present for critical portions of any procedures performed.  CHF (EF 20%) with atrial fibrillation, recently started on eliquis, presenting with worsening dyspnea. Mild dyspnea with JVD, basilar crackles and ankle edema.  IV lasix given.  Glynn Octave, MD 08/25/12 [phone]

## 2012-08-25 NOTE — ED Provider Notes (Signed)
History     CSN: [id number]  Arrival date & time 08/25/12  0756   First MD Initiated Contact with Patient 08/25/12 0801      Chief Complaint  Patient presents with  . Shortness of Breath   (Consider location/radiation/quality/duration/timing/severity/associated sxs/prior treatment) HPI Comments: Mark Hess is a 79 year old white male with extensive cardiac history including Afib on amiodarone, s/p pacemaker in place, CHF with EF 20-25% and diffuse hypokinesis with moderate AR on echo 05/2012, CAD, DM, HTN, and COPD presenting via EMS with complaints of worsening shortness of breath.  His two sons are present in the room as well who explain that when he woke up this morning around 5am, he was having a harder time breathing and difficult getting his words out. Mark Hess claims his breathing has been getting worse over the past few days and he has also had an occasional productive cough and daily subjective fevers.  He endorses 7 pound weight gain over the past two days and worsening lower extremity edema despite taking his lasix.  His family explains he told them his weight was 132 two days prior and this morning was 140lb.  His last known weight on epic is 140lbs on 08/22/12.  He does not sleep very well at night, uses 1 pillow, but usually needs to sleep on his side and prefers to sleep on the couch.  He also has worsening dyspnea on exertion, not being able to walk even short distances without being short of breath and fatigue. He endorses having mild chest pain this morning running across the top part of his right and left chest that subsequently resolved and is the same pain he has been having in the past at times.  Mark Hess has also been having severe b/l headaches that start behind his ear for at least one year now and his last episode was last night and has been having more nausea ever since he was started on Eliquis on 08/22/12.  He currently denies any chest pain, vomiting, headache,  congestion, dizziness, abdominal pain, or any urinary complaints at this time.   Of note, his cardiologist is Dr. Johney Frame and he is scheduled for TEE guided cardioversion with Dr. Jens Som on 08/31/12.   He was last seen by Dr. Johney Frame on 08/22/12 where his amiodarone was increased to 200mg  BID x 7 days then 200mg  daily thereafter for his Afib and started on eliquis 2.5mg  BID.  Aspirin was stopped at that visit.     Patient is a 79 y.o. male presenting with shortness of breath. The history is provided by the patient and a relative. No language interpreter was used.  Shortness of Breath Severity:  Moderate Onset quality:  Gradual Timing:  Constant Progression:  Improving Chronicity:  Chronic Context: activity   Context: not fumes   Context comment:  Hx of CHF Relieved by:  Oxygen Worsened by:  Exertion, movement and activity Associated symptoms: chest pain, cough, diaphoresis, fever and headaches   Associated symptoms: no abdominal pain, no hemoptysis, no sore throat, no syncope, no vomiting and no wheezing   Chest pain:    Quality:  Aching   Severity:  Mild   Onset quality:  Gradual   Timing:  Sporadic   Progression:  Waxing and waning   Chronicity:  Recurrent Cough:    Cough characteristics:  Productive   Sputum characteristics:  Nondescript   Severity:  Moderate (worse at night)   Onset quality:  Gradual   Timing:  Sporadic  Progression:  Waxing and waning   Chronicity:  Recurrent Fever:    Timing:  Intermittent   Temp source:  Subjective   Progression:  Resolved Headaches:    Severity:  Moderate   Onset quality:  Gradual   Timing:  Sporadic   Progression:  Waxing and waning   Chronicity:  Chronic Risk factors: no recent alcohol use, no family hx of DVT, no hx of cancer, no hx of PE/DVT, no obesity, no prolonged immobilization and no tobacco use     Past Medical History  Diagnosis Date  . Chronic systolic heart failure     a. EF 30 to 35% per echo in Jan 2011; b. Echo  3/14:  mild LVH, EF 20-25%, diff HK, mod MR, mod LAE, PASP 48  . Coronary artery disease     Mild CAD per cath in 2009  . Atrial fibrillation     a. on amio w/ recurrent afib 08/2012;  b. eliquis started 08/2012.  . Nonsustained ventricular tachycardia   . Alcohol abuse   . Diabetes mellitus   . Hypertension   . Tobacco abuse     quit  . COPD (chronic obstructive pulmonary disease)   . GERD (gastroesophageal reflux disease)   . Gout   . Dizziness     Chronic  . Nonischemic cardiomyopathy     Past Surgical History  Procedure Laterality Date  . Transthoracic echocardiogram  03/2009    SHOWED EF IN THE 30 to 35% RANGE  . Cardiac catheterization  06/26/2007    THERE WAS GLOBAL HYPOKINESUS AND EF 15-20%. SHOWED MILD CORONARY DISEASE WITH A 50% DISTAL RIGHT CORONARY IN 20-30% SCATTERED NARROWINGS IN THE LEFT CIRCUMFLEX. HE DID HAVE GLOBAL LV DYSFUNCTION AT THAT TIME  . Insert / replace / remove pacemaker  08/2007  . Cholecystectomy    . Cardioversion    . Cardiovascular stress test  03/2009    Family History  Problem Relation Age of Onset  . Heart attack Brother   . Atrial fibrillation Son   . Arrhythmia Son     History  Substance Use Topics  . Smoking status: Former Smoker    Quit date: 05/13/2007  . Smokeless tobacco: Current User    Types: Chew  . Alcohol Use: No    Review of Systems  Constitutional: Positive for fever and diaphoresis.  HENT: Negative for congestion and sore throat.   Eyes: Negative.  Negative for photophobia and visual disturbance.  Respiratory: Positive for cough, chest tightness and shortness of breath. Negative for hemoptysis and wheezing.   Cardiovascular: Positive for chest pain and leg swelling. Negative for palpitations and syncope.  Gastrointestinal: Positive for nausea. Negative for vomiting, abdominal pain, diarrhea, constipation and abdominal distention.  Endocrine: Negative.   Genitourinary: Negative.  Negative for dysuria.   Musculoskeletal: Negative.  Negative for gait problem.  Skin: Negative.   Allergic/Immunologic: Negative.   Neurological: Positive for weakness and headaches. Negative for dizziness and syncope.  Hematological: Negative.   Psychiatric/Behavioral: Negative.     Allergies  Review of patient's allergies indicates no known allergies.  Home Medications   No current outpatient prescriptions on file. BP 126/84  Pulse 59  Temp(Src) 97.5 F (36.4 C) (Oral)  Resp 20  Ht 5\' 9"  (1.753 m)  Wt 138 lb 1.6 oz (62.642 kg)  BMI 20.38 kg/m2  SpO2 96%  Physical Exam  Constitutional: He is oriented to person, place, and time. He appears well-developed. No distress.  HENT:  Head: Normocephalic and  atraumatic.  Eyes: Conjunctivae and EOM are normal. Pupils are equal, round, and reactive to light.  Neck: Normal range of motion. Neck supple.  Cardiovascular: Intact distal pulses.   Irregularly irregular   Pulmonary/Chest:  B/l crackles  Abdominal: Soft. Bowel sounds are normal. He exhibits no distension. There is no tenderness. There is no guarding.  Musculoskeletal: Normal range of motion. He exhibits edema. He exhibits no tenderness.  +2 b/l pitting lower extremity edema  Neurological: He is alert and oriented to person, place, and time. No cranial nerve deficit.  Skin: Skin is warm and dry. He is not diaphoretic.  Ecchymosis on b/l upper extremities  Psychiatric: He has a normal mood and affect. His behavior is normal. Judgment and thought content normal.    ED Course  Procedures (including critical care time)  Labs Reviewed  CBC WITH DIFFERENTIAL - Abnormal; Notable for the following:    RBC 4.20 (*)    Hemoglobin 11.4 (*)    HCT 35.5 (*)    RDW 17.1 (*)    All other components within normal limits  COMPREHENSIVE METABOLIC PANEL - Abnormal; Notable for the following:    Glucose, Bld 128 (*)    BUN 45 (*)    Creatinine, Ser 2.63 (*)    Albumin 3.3 (*)    GFR calc non Af Amer 20  (*)    GFR calc Af Amer 24 (*)    All other components within normal limits  PROTIME-INR - Abnormal; Notable for the following:    Prothrombin Time 16.0 (*)    All other components within normal limits  PRO B NATRIURETIC PEPTIDE - Abnormal; Notable for the following:    Pro B Natriuretic peptide (BNP) 30020.0 (*)    All other components within normal limits  TROPONIN I  URINALYSIS, ROUTINE W REFLEX MICROSCOPIC  TSH   Dg Chest Portable 1 View  08/25/2012   *RADIOLOGY REPORT*  Clinical Data: Short of breath  PORTABLE CHEST - 1 VIEW  Comparison: Prior chest x-ray 05/09/2012  Findings: Stable position of right subclavian approach cardiac rhythm maintenance device with leads projecting over the right atrium and right ventricle.  Slightly increased pulmonary vascular congestion now with mild interstitial edema.  Small right pleural effusion and associated right basilar opacity.  Slightly increased enlargement the cardiopericardial silhouette.  Atherosclerotic calcifications again noted the transverse aorta.  No acute osseous abnormality on this single view.  No pneumothorax.  IMPRESSION:  Mild CHF with increased prominence of the cardiac silhouette, mild interstitial edema and small right pleural effusion with associated right basilar atelectasis.   Original Report Authenticated By: Malachy Moan, M.D.     1. Acute on chronic systolic CHF (congestive heart failure)   2. Atrial fibrillation      Date: 08/25/2012  Rate: 77bp,  Rhythm: atrial fibrillation  QRS Axis: indeterminate  Intervals: normal  ST/T Wave abnormalities: nonspecific ST changes  Conduction Disutrbances:nonspecific intraventricular conduction delay  Narrative Interpretation: 77bpm Afib with PVCs  Old EKG Reviewed: changes noted 08/22/12: 76bpm, electronic ventricular pacemaker   MDM  Mark Hess is a 79 year old male with extensive cardiac history significant for Afib on amiodarone and recently started on Eliquis, CHF echo  05/2012: EF 20-25%, pacemaker in place, DM, and HTN presenting with worsening SOB.  He is scheduled for TEE guided cardioversion on 08/31/12 with Dr. Jens Som.  Followed by Dr. Johney Frame. +weight gain, lower extremity edema, and worsening orthopnea, PND, and dyspnea.  B/l crackles on auscultation, still in Afib.  On 2L [place] O2,o2 sat 97-98% on room air.  CXR with mild CHF and increased, mild interstitial edema and small R pleural effusion with right basilar atelectasis.  proBNP 30,000.  Likely CHF exacerbation.   Will be admitted to cardiology service, Dr. Shirlee Latch, discussed with Ward Givens, NP.    -CBC: Hb 11.4 -CMET: Cr increased to 2.63 from 2.0 on 6/11 -PT/INR: 16/1.31 -BNP 30,020 -Troponin x1 negative -repeat EKG: 80bpm, afib and v-paced complexes, t wave inversions lateral leads I, V5 and V6 -CXR  -U/A -Lasix 40mg  IV x1 -ASA 325mg  x1 -consulted [place] [facility], spoke with Ward Givens, NP; Dr. Shirlee Latch on call; will admit  Case discussed and patient seen with Dr. Manus Gunning who agrees with assessment and plan.  Signed: Darden Palmer, MD PGY-I, Internal Medicine Resident Pager: [phone]  08/25/2012,3:57 PM        Darden Palmer, MD 08/25/12 [phone]

## 2012-08-26 LAB — BASIC METABOLIC PANEL
Chloride: 103 mEq/L (ref 96–112)
Creatinine, Ser: 2.34 mg/dL — ABNORMAL HIGH (ref 0.50–1.35)
GFR calc Af Amer: 27 mL/min — ABNORMAL LOW (ref >90)
Potassium: 4.5 mEq/L (ref 3.5–5.1)
Sodium: 137 mEq/L (ref 135–145)

## 2012-08-26 MED ORDER — ZOLPIDEM TARTRATE 5 MG PO TABS
5.0000 mg | ORAL_TABLET | Freq: Every evening | ORAL | Status: DC | PRN
  Administered 2012-08-26 (×2): 5 mg via ORAL
  Filled 2012-08-26 (×2): qty 1

## 2012-08-26 MED ORDER — SODIUM CHLORIDE 0.9 % IV SOLN
INTRAVENOUS | Status: DC
  Administered 2012-08-27: 06:00:00 via INTRAVENOUS

## 2012-08-26 MED ORDER — FUROSEMIDE 10 MG/ML IJ SOLN
40.0000 mg | Freq: Every day | INTRAMUSCULAR | Status: DC
Start: 2012-08-27 — End: 2012-08-27
  Filled 2012-08-26: qty 4

## 2012-08-26 NOTE — Progress Notes (Signed)
Assumed care for patient. No change in initial assessment. Pt resting quietly in bed without c/o at this time. Son at bedside.. Will contine with current plan of care

## 2012-08-26 NOTE — Progress Notes (Signed)
   Subjective:  Denies CP; dyspnea improved; mildly confused   Objective:  Filed Vitals:   08/25/12 1415 08/25/12 1450 08/25/12 2100 08/26/12 0500  BP:  126/84 121/77 113/75  Pulse: 67 59 76 69  Temp:  97.5 F (36.4 C) 98.4 F (36.9 C) 98.4 F (36.9 C)  TempSrc:  Oral Oral Oral  Resp: 24 20 20 20   Height:  5\' 9"  (1.753 m)    Weight:  138 lb 1.6 oz (62.642 kg)  130 lb 8.2 oz (59.2 kg)  SpO2: 99% 96% 97% 97%    Intake/Output from previous day:  Intake/Output Summary (Last 24 hours) at 08/26/12 1029 Last data filed at 08/26/12 0645  Gross per 24 hour  Intake    561 ml  Output   3375 ml  Net  -2814 ml    Physical Exam: Physical exam: Well-developed well-nourished in no acute distress.  Skin is warm and dry.  HEENT is normal.  Neck is supple. No thyromegaly.  Chest is clear to auscultation with normal expansion.  Cardiovascular exam is irregular Abdominal exam nontender or distended. No masses palpated. Extremities show no edema. neuro grossly intact other than mild confusion.    Lab Results: Basic Metabolic Panel:  Recent Labs  16/10/96 0815 08/26/12 0430  NA 141 137  K 4.3 4.5  CL 109 103  CO2 20 22  GLUCOSE 128* 125*  BUN 45* 52*  CREATININE 2.63* 2.34*  CALCIUM 9.4 9.8   CBC:  Recent Labs  08/25/12 0815  WBC 9.1  NEUTROABS 5.4  HGB 11.4*  HCT 35.5*  MCV 84.5  PLT 205   Cardiac Enzymes:  Recent Labs  08/25/12 0815  TROPONINI <0.30     Assessment/Plan:  1 atrial fibrillation-continue amiodarone, apixaban and carvedilol. This is most likely contributing to his congestive heart failure. Proceed with TEE guided cardioversion tomorrow morning. 2 acute on chronic systolic congestive heart failure-patient's volume status has improved. His BUN is increasing. Change Lasix to 40 mg IV daily. Check potassium and renal function tomorrow morning. 3 nonischemic cardiomyopathy-continue beta blocker, hydralazine and nitrates. No ACE inhibitor given  severity of renal insufficiency. 4 acute kidney injury on chronic kidney disease-follow renal function closely with diuresis.  [name] 08/26/2012, 10:29 AM

## 2012-08-26 NOTE — Progress Notes (Signed)
Pharmacist Heart Failure Core Measure Documentation  Assessment: Mark Hess has an EF documented as 20-25% by ECHO per MD note.  Rationale: Heart failure patients with left ventricular systolic dysfunction (LVSD) and an EF < 40% should be prescribed an angiotensin converting enzyme inhibitor (ACEI) or angiotensin receptor blocker (ARB) at discharge unless a contraindication is documented in the medical record.  This patient is not currently on an ACEI or ARB for HF.  This note is being placed in the record in order to provide documentation that a contraindication to the use of these agents is present for this encounter.  ACE Inhibitor or Angiotensin Receptor Blocker is contraindicated (specify all that apply)  []   ACEI allergy AND ARB allergy []   Angioedema []   Moderate or severe aortic stenosis []   Hyperkalemia []   Hypotension []   Renal artery stenosis [x]   Worsening renal function, preexisting renal disease or dysfunction No ACEI per MD note given renal function, pt is on hydralazine + imdur combo   [name] 08/26/2012 2:11 PM

## 2012-08-26 NOTE — Progress Notes (Signed)
Tylenol giving for HA. Pt states its common for him to get on in the afternoon

## 2012-08-26 NOTE — Progress Notes (Signed)
NPO after midnight for TEE. Ambien ordered and pt slept well after administration.

## 2012-08-26 NOTE — Plan of Care (Signed)
Problem: Phase I Progression Outcomes Goal: Initial discharge plan identified Outcome: Progressing Home with family

## 2012-08-26 NOTE — Progress Notes (Signed)
Pt urine not recorded for day shift. Pt did not use urinal. Pt stated he did know he still need to use the urinal. Explained to Pt that we still need to record his output. Pt stated will would started back using the urinal.

## 2012-08-27 ENCOUNTER — Encounter (HOSPITAL_COMMUNITY): Payer: Self-pay | Admitting: Gastroenterology

## 2012-08-27 ENCOUNTER — Encounter (HOSPITAL_COMMUNITY)
Admission: EM | Disposition: A | Discharge status: Home Health | Payer: Self-pay | Source: Home / Self Care | Attending: Cardiology

## 2012-08-27 ENCOUNTER — Inpatient Hospital Stay (HOSPITAL_COMMUNITY): Payer: Medicare Other | Admitting: Anesthesiology

## 2012-08-27 ENCOUNTER — Encounter (HOSPITAL_COMMUNITY): Payer: Self-pay | Admitting: Anesthesiology

## 2012-08-27 DIAGNOSIS — I4891 Unspecified atrial fibrillation: Secondary | ICD-10-CM

## 2012-08-27 HISTORY — PX: CARDIOVERSION: SHX1299

## 2012-08-27 HISTORY — PX: TEE WITHOUT CARDIOVERSION: SHX5443

## 2012-08-27 LAB — BASIC METABOLIC PANEL
Calcium: 9.7 mg/dL (ref 8.4–10.5)
GFR calc Af Amer: 22 mL/min — ABNORMAL LOW (ref >90)
GFR calc non Af Amer: 19 mL/min — ABNORMAL LOW (ref >90)
Potassium: 4.6 mEq/L (ref 3.5–5.1)
Sodium: 140 mEq/L (ref 135–145)

## 2012-08-27 LAB — GLUCOSE, CAPILLARY

## 2012-08-27 SURGERY — ECHOCARDIOGRAM, TRANSESOPHAGEAL
Anesthesia: Moderate Sedation

## 2012-08-27 SURGERY — ECHOCARDIOGRAM, TRANSESOPHAGEAL
Anesthesia: General

## 2012-08-27 MED ORDER — ETOMIDATE 2 MG/ML IV SOLN
INTRAVENOUS | Status: DC | PRN
  Administered 2012-08-27: 12 mg via INTRAVENOUS

## 2012-08-27 MED ORDER — FUROSEMIDE 40 MG PO TABS
40.0000 mg | ORAL_TABLET | Freq: Every day | ORAL | Status: DC
  Administered 2012-08-27 – 2012-08-28 (×2): 40 mg via ORAL
  Filled 2012-08-27 (×2): qty 1

## 2012-08-27 MED ORDER — MIDAZOLAM HCL 5 MG/ML IJ SOLN
INTRAMUSCULAR | Status: AC
  Filled 2012-08-27: qty 2

## 2012-08-27 MED ORDER — COLCHICINE 0.6 MG PO TABS
0.6000 mg | ORAL_TABLET | Freq: Every day | ORAL | Status: DC
  Administered 2012-08-27 – 2012-08-28 (×2): 0.6 mg via ORAL
  Filled 2012-08-27 (×2): qty 1

## 2012-08-27 MED ORDER — MIDAZOLAM HCL 10 MG/2ML IJ SOLN
INTRAMUSCULAR | Status: DC | PRN
  Administered 2012-08-27: 2 mg via INTRAVENOUS

## 2012-08-27 MED ORDER — HYDROCODONE-ACETAMINOPHEN 5-325 MG PO TABS
1.0000 | ORAL_TABLET | Freq: Once | ORAL | Status: AC
  Administered 2012-08-27: 1 via ORAL
  Filled 2012-08-27: qty 1

## 2012-08-27 MED ORDER — BUTAMBEN-TETRACAINE-BENZOCAINE 2-2-14 % EX AERO
INHALATION_SPRAY | CUTANEOUS | Status: DC | PRN
  Administered 2012-08-27 (×2): 1 via TOPICAL

## 2012-08-27 MED ORDER — SODIUM CHLORIDE 0.9 % IV SOLN: INTRAVENOUS | Status: DC

## 2012-08-27 MED ORDER — FENTANYL CITRATE 0.05 MG/ML IJ SOLN
INTRAMUSCULAR | Status: AC
  Filled 2012-08-27: qty 2

## 2012-08-27 NOTE — Progress Notes (Signed)
Echocardiogram Echocardiogram Transesophageal has been performed.  Jamieon Lannen 08/27/2012, 12:21 PM

## 2012-08-27 NOTE — Preoperative (Signed)
Beta Blockers   Reason not to administer Beta Blockers:Carvedilol taken at 1053 hrs on 08/27/12

## 2012-08-27 NOTE — Progress Notes (Signed)
Pt haveing Gout pain this AM. Tylenol giving. Pt sch of TEE w/ possible cardover at 1030

## 2012-08-27 NOTE — Transfer of Care (Signed)
Immediate Anesthesia Transfer of Care Note  Patient: Mark Hess  Procedure(s) Performed: Procedure(s): TRANSESOPHAGEAL ECHOCARDIOGRAM (TEE) (N/A) CARDIOVERSION (N/A)  Patient Location: Endoscopy Unit  Anesthesia Type:General  Level of Consciousness: awake  Airway & Oxygen Therapy: Patient Spontanous Breathing and Patient connected to nasal cannula oxygen  Post-op Assessment: Report given to PACU RN, Post -op Vital signs reviewed and stable and Patient moving all extremities  Post vital signs: Reviewed and stable  Complications: No apparent anesthesia complications

## 2012-08-27 NOTE — Progress Notes (Signed)
Patient had complaints of severe gout pain to left toe/foot.  PRN Tylenol given to patient. Patient currently resting comfortably, will continue to monitor. Troy Sine

## 2012-08-27 NOTE — H&P (View-Only) (Signed)
Patient ID: Mark Hess, male   DOB: 12-21-1923, 79 y.o.   MRN: [id number]    SUBJECTIVE: Breathing better.  Still in atrial fibrillation, rate is controlled. Complains of gout pain in great toe.   . allopurinol  300 mg Oral Daily  . amiodarone  200 mg Oral BID  . apixaban  2.5 mg Oral BID  . carvedilol  3.125 mg Oral Daily  . furosemide  40 mg Oral Daily  . hydrALAZINE  10 mg Oral TID  . isosorbide mononitrate  15 mg Oral Daily  . pantoprazole  40 mg Oral Daily  . predniSONE  5 mg Oral Daily  . sodium chloride  3 mL Intravenous Q12H      Filed Vitals:   08/26/12 1435 08/26/12 1700 08/26/12 2124 08/27/12 0500  BP: 109/64 124/72 109/74 98/50  Pulse: 69 69 67 94  Temp: 97.4 F (36.3 C)  97.2 F (36.2 C) 97.5 F (36.4 C)  TempSrc: Axillary  Oral Oral  Resp: 18 18 19 20   Height:      Weight:    130 lb 4.7 oz (59.1 kg)  SpO2: 98% 95% 95% 98%    Intake/Output Summary (Last 24 hours) at 08/27/12 0740 Last data filed at 08/26/12 1930  Gross per 24 hour  Intake    820 ml  Output      0 ml  Net    820 ml    LABS: Basic Metabolic Panel:  Recent Labs  02/72/53 0430 08/27/12 0345  NA 137 140  K 4.5 4.6  CL 103 103  CO2 22 26  GLUCOSE 125* 120*  BUN 52* 63*  CREATININE 2.34* 2.84*  CALCIUM 9.8 9.7   Liver Function Tests:  Recent Labs  08/25/12 0815  AST 22  ALT 23  ALKPHOS 80  BILITOT 0.4  PROT 6.5  ALBUMIN 3.3*   No results found for this basename: LIPASE, AMYLASE,  in the last 72 hours CBC:  Recent Labs  08/25/12 0815  WBC 9.1  NEUTROABS 5.4  HGB 11.4*  HCT 35.5*  MCV 84.5  PLT 205   Cardiac Enzymes:  Recent Labs  08/25/12 0815  TROPONINI <0.30   BNP: No components found with this basename: POCBNP,  D-Dimer: No results found for this basename: DDIMER,  in the last 72 hours Hemoglobin A1C: No results found for this basename: HGBA1C,  in the last 72 hours Fasting Lipid Panel: No results found for this basename: CHOL, HDL, LDLCALC,  TRIG, CHOLHDL, LDLDIRECT,  in the last 72 hours Thyroid Function Tests:  Recent Labs  08/25/12 1535  TSH 2.901   Anemia Panel: No results found for this basename: VITAMINB12, FOLATE, FERRITIN, TIBC, IRON, RETICCTPCT,  in the last 72 hours  RADIOLOGY: Dg Chest Portable 1 View  08/25/2012   *RADIOLOGY REPORT*  Clinical Data: Short of breath  PORTABLE CHEST - 1 VIEW  Comparison: Prior chest x-ray 05/09/2012  Findings: Stable position of right subclavian approach cardiac rhythm maintenance device with leads projecting over the right atrium and right ventricle.  Slightly increased pulmonary vascular congestion now with mild interstitial edema.  Small right pleural effusion and associated right basilar opacity.  Slightly increased enlargement the cardiopericardial silhouette.  Atherosclerotic calcifications again noted the transverse aorta.  No acute osseous abnormality on this single view.  No pneumothorax.  IMPRESSION:  Mild CHF with increased prominence of the cardiac silhouette, mild interstitial edema and small right pleural effusion with associated right basilar atelectasis.   Original Report Authenticated  By: Malachy Moan, M.D.    PHYSICAL EXAM General: NAD Neck: JVP 7-8 cm, no thyromegaly or thyroid nodule.  Lungs: Clear to auscultation bilaterally with normal respiratory effort. CV: Nondisplaced PMI.  Heart irregular S1/S2, no S3/S4, no murmur.  No peripheral edema.   Abdomen: Soft, nontender, no hepatosplenomegaly, no distention.  Neurologic: Alert and oriented x 3.  Psych: Normal affect. Extremities: No clubbing or cyanosis.   TELEMETRY: Reviewed telemetry pt in atrial fibrillation with occasional pacing  ASSESSMENT AND PLAN: 79 yo with history of nonischemic cardiomyopathy, chronic atrial fibrillation, and CKD was admitted today with volume overload/dyspnea.  1. CHF: Acute on chronic systolic CHF, EF 69-62% last echo. Volume status has improved on exam and weight is down.   Creatinine has risen. It is possible that this exacerbation was triggered by going back into atrial fibrillation.  - Change Lasix to po. - Continue low dose hydralazine/nitrates. - Continue low dose Coreg  - No ACEI with elevated creatinine.  2. Atrial fibrillation: Rate is controlled. Possible cause of exacerbation.  - Continue Coreg and apixaban (dosed 2.5 mg bid).  - TEE-guided cardioversion today.  3. AKI on CKD: Creatinine higher today, baseline around 2, 2.6 at admission and 2.8 today.  Lasix changed to po.  4. Gout: Pain in MTP great toe.  Will given colchicine once daily.  Marca Ancona 08/27/2012 7:43 AM

## 2012-08-27 NOTE — Anesthesia Postprocedure Evaluation (Signed)
  Anesthesia Post-op Note  Patient: Mark Hess  Procedure(s) Performed: Procedure(s): TRANSESOPHAGEAL ECHOCARDIOGRAM (TEE) (N/A) CARDIOVERSION (N/A)  Patient Location: Endoscopy Unit  Anesthesia Type:General  Level of Consciousness: sedated  Airway and Oxygen Therapy: Patient Spontanous Breathing and Patient connected to nasal cannula oxygen  Post-op Pain: none  Post-op Assessment: Post-op Vital signs reviewed, Patient's Cardiovascular Status Stable, PATIENT'S CARDIOVASCULAR STATUS UNSTABLE, Patent Airway and No signs of Nausea or vomiting  Post-op Vital Signs: Reviewed and stable  Complications: No apparent anesthesia complications

## 2012-08-27 NOTE — Interval H&P Note (Signed)
History and Physical Interval Note:  08/27/2012 11:44 AM  Mark Hess  has presented today for surgery, with the diagnosis of a-fib  The various methods of treatment have been discussed with the patient and family. After consideration of risks, benefits and other options for treatment, the patient has consented to  Procedure(s): TRANSESOPHAGEAL ECHOCARDIOGRAM (TEE) (N/A) CARDIOVERSION (N/A) as a surgical intervention .  The patient's history has been reviewed, patient examined, no change in status, stable for surgery.  I have reviewed the patient's chart and labs.  Questions were answered to the patient's satisfaction.     [name]

## 2012-08-27 NOTE — Progress Notes (Addendum)
Patient ID: Mark Hess, male   DOB: 12/31/1923, 79 y.o.   MRN: [id number]    SUBJECTIVE: Breathing better.  Still in atrial fibrillation, rate is controlled. Complains of gout pain in great toe.   . allopurinol  300 mg Oral Daily  . amiodarone  200 mg Oral BID  . apixaban  2.5 mg Oral BID  . carvedilol  3.125 mg Oral Daily  . furosemide  40 mg Oral Daily  . hydrALAZINE  10 mg Oral TID  . isosorbide mononitrate  15 mg Oral Daily  . pantoprazole  40 mg Oral Daily  . predniSONE  5 mg Oral Daily  . sodium chloride  3 mL Intravenous Q12H      Filed Vitals:   08/26/12 1435 08/26/12 1700 08/26/12 2124 08/27/12 0500  BP: 109/64 124/72 109/74 98/50  Pulse: 69 69 67 94  Temp: 97.4 F (36.3 C)  97.2 F (36.2 C) 97.5 F (36.4 C)  TempSrc: Axillary  Oral Oral  Resp: 18 18 19 20  Height:      Weight:    130 lb 4.7 oz (59.1 kg)  SpO2: 98% 95% 95% 98%    Intake/Output Summary (Last 24 hours) at 08/27/12 0740 Last data filed at 08/26/12 1930  Gross per 24 hour  Intake    820 ml  Output      0 ml  Net    820 ml    LABS: Basic Metabolic Panel:  Recent Labs  08/26/12 0430 08/27/12 0345  NA 137 140  K 4.5 4.6  CL 103 103  CO2 22 26  GLUCOSE 125* 120*  BUN 52* 63*  CREATININE 2.34* 2.84*  CALCIUM 9.8 9.7   Liver Function Tests:  Recent Labs  08/25/12 0815  AST 22  ALT 23  ALKPHOS 80  BILITOT 0.4  PROT 6.5  ALBUMIN 3.3*   No results found for this basename: LIPASE, AMYLASE,  in the last 72 hours CBC:  Recent Labs  08/25/12 0815  WBC 9.1  NEUTROABS 5.4  HGB 11.4*  HCT 35.5*  MCV 84.5  PLT 205   Cardiac Enzymes:  Recent Labs  08/25/12 0815  TROPONINI <0.30   BNP: No components found with this basename: POCBNP,  D-Dimer: No results found for this basename: DDIMER,  in the last 72 hours Hemoglobin A1C: No results found for this basename: HGBA1C,  in the last 72 hours Fasting Lipid Panel: No results found for this basename: CHOL, HDL, LDLCALC,  TRIG, CHOLHDL, LDLDIRECT,  in the last 72 hours Thyroid Function Tests:  Recent Labs  08/25/12 1535  TSH 2.901   Anemia Panel: No results found for this basename: VITAMINB12, FOLATE, FERRITIN, TIBC, IRON, RETICCTPCT,  in the last 72 hours  RADIOLOGY: Dg Chest Portable 1 View  08/25/2012   *RADIOLOGY REPORT*  Clinical Data: Short of breath  PORTABLE CHEST - 1 VIEW  Comparison: Prior chest x-ray 05/09/2012  Findings: Stable position of right subclavian approach cardiac rhythm maintenance device with leads projecting over the right atrium and right ventricle.  Slightly increased pulmonary vascular congestion now with mild interstitial edema.  Small right pleural effusion and associated right basilar opacity.  Slightly increased enlargement the cardiopericardial silhouette.  Atherosclerotic calcifications again noted the transverse aorta.  No acute osseous abnormality on this single view.  No pneumothorax.  IMPRESSION:  Mild CHF with increased prominence of the cardiac silhouette, mild interstitial edema and small right pleural effusion with associated right basilar atelectasis.   Original Report Authenticated   By: Heath McCullough, M.D.    PHYSICAL EXAM General: NAD Neck: JVP 7-8 cm, no thyromegaly or thyroid nodule.  Lungs: Clear to auscultation bilaterally with normal respiratory effort. CV: Nondisplaced PMI.  Heart irregular S1/S2, no S3/S4, no murmur.  No peripheral edema.   Abdomen: Soft, nontender, no hepatosplenomegaly, no distention.  Neurologic: Alert and oriented x 3.  Psych: Normal affect. Extremities: No clubbing or cyanosis.   TELEMETRY: Reviewed telemetry pt in atrial fibrillation with occasional pacing  ASSESSMENT AND PLAN: 79 yo with history of nonischemic cardiomyopathy, chronic atrial fibrillation, and CKD was admitted today with volume overload/dyspnea.  1. CHF: Acute on chronic systolic CHF, EF 20-25% last echo. Volume status has improved on exam and weight is down.   Creatinine has risen. It is possible that this exacerbation was triggered by going back into atrial fibrillation.  - Change Lasix to po. - Continue low dose hydralazine/nitrates. - Continue low dose Coreg  - No ACEI with elevated creatinine.  2. Atrial fibrillation: Rate is controlled. Possible cause of exacerbation.  - Continue Coreg and apixaban (dosed 2.5 mg bid).  - TEE-guided cardioversion today.  3. AKI on CKD: Creatinine higher today, baseline around 2, 2.6 at admission and 2.8 today.  Lasix changed to po.  4. Gout: Pain in MTP great toe.  Will given colchicine once daily.  Dalton McLean 08/27/2012 7:43 AM  

## 2012-08-27 NOTE — Anesthesia Preprocedure Evaluation (Addendum)
Anesthesia Evaluation  Patient identified by MRN, date of birth, ID band Patient awake    Reviewed: Allergy & Precautions, H&P , NPO status , Patient's Chart, lab work & pertinent test results  History of Anesthesia Complications Negative for: history of anesthetic complications  Airway Mallampati: II TM Distance: >3 FB Neck ROM: Full    Dental  (+) Edentulous Upper and Edentulous Lower   Pulmonary COPDformer smoker (quit '09),  breath sounds clear to auscultation  Pulmonary exam normal       Cardiovascular hypertension, Pt. on medications and Pt. on home beta blockers + CAD ('09 cath: mild non-obstructive ASCADz) + dysrhythmias Atrial Fibrillation and Ventricular Tachycardia + pacemaker (for SA node dysfunction) Rhythm:Irregular Rate:Normal  3/13 ECHO: mod MR, EF 20-25%, afib EF 15% by TEE today   Neuro/Psych negative neurological ROS     GI/Hepatic Neg liver ROS, GERD-  Medicated,  Endo/Other  diabetes (glu 120)  Renal/GU Renal InsufficiencyRenal disease (creat 2.84)     Musculoskeletal   Abdominal   Peds  Hematology   Anesthesia Other Findings   Reproductive/Obstetrics                          Anesthesia Physical Anesthesia Plan  ASA: III  Anesthesia Plan: General   Post-op Pain Management:    Induction: Intravenous  Airway Management Planned: Natural Airway and Mask  Additional Equipment:   Intra-op Plan:   Post-operative Plan:   Informed Consent: I have reviewed the patients History and Physical, chart, labs and discussed the procedure including the risks, benefits and alternatives for the proposed anesthesia with the patient or authorized representative who has indicated his/her understanding and acceptance.     Plan Discussed with: CRNA and Surgeon  Anesthesia Plan Comments: (Plan routine monitors, GA for cardioversion)        Anesthesia Quick Evaluation

## 2012-08-27 NOTE — Care Management Note (Signed)
    Page 1 of 2   08/27/2012     10:56:15 AM   CARE MANAGEMENT NOTE 08/27/2012  Patient:  Mark Hess, Mark Hess   Account Number:  [id number]  Date Initiated:  08/27/2012  Documentation initiated by:  Oletta Cohn  Subjective/Objective Assessment:   79 y/o male with h/o NICM/CHF and PAF on amio and eliquis with plan for outpt dccv 6/20 who presented to the ED this AM with dyspnea and chf went into afib/ Hm with spouse     Action/Plan:   diurese, cardioversion/ home with home health   Anticipated DC Date:  08/30/2012   Anticipated DC Plan:  HOME W HOME HEALTH SERVICES      DC Planning Services  CM consult      [facility] Choice  HOME HEALTH   Choice offered to / List presented to:  C-1 Patient        HH arranged  HH-1 RN  HH-10 DISEASE MANAGEMENT      HH agency  [facility]   Status of service:  Completed, signed off Medicare Important Message given?   (If response is "NO", the following Medicare IM given date fields will be blank) Date Medicare IM given:   Date Additional Medicare IM given:    Discharge Disposition:    Per UR Regulation:    If discussed at Long Length of Stay Meetings, dates discussed:    Comments:  08/27/12.Marland KitchenMarland KitchenOletta Cohn, RN, BSN, [employer] [phone] Spoke with pt and family (spouse, children, grandchildren) at bedside concerning discharge planning and management of CHF.  Pt and family familiar with services and chose [facility] to render services.  Darlin Drop of [facility] notified.  No DME needs identified at this time.

## 2012-08-27 NOTE — Progress Notes (Signed)
Patient evaluated for long-term disease management services with [facility] Care Management Program. Came to visit patient at bedside. However, he is currently off the unit. Will follow up at later time.      Raiford Noble, MSN-Ed, RN,BSN, [facility], [phone]

## 2012-08-27 NOTE — Progress Notes (Signed)
Pt back from TEE Ox4, VS wnl, pt complaining of gout pain. NP wants to stated to give colchicine time to work. Will continue to monitor

## 2012-08-27 NOTE — Progress Notes (Signed)
Pt Gout pain some what releaved by current meds. But Pt still rates pain at 6-7 on 0-10 scale. Pt would like to speak with MD about increasing predbisone dose.

## 2012-08-27 NOTE — CV Procedure (Signed)
See full TEE report in [employer]; no LAA thrombus identified; patient subsequently sedated by anesthesia with etomidate 12 mg IV; synchronized DCCV with 120 joules resulted in sinus rhythm. No immediate complications. Continue apixaban and amiodarone. Mark Hess

## 2012-08-28 ENCOUNTER — Other Ambulatory Visit: Payer: Self-pay | Admitting: Physician Assistant

## 2012-08-28 ENCOUNTER — Encounter (HOSPITAL_COMMUNITY): Payer: Self-pay | Admitting: Physician Assistant

## 2012-08-28 DIAGNOSIS — I5023 Acute on chronic systolic (congestive) heart failure: Secondary | ICD-10-CM

## 2012-08-28 LAB — BASIC METABOLIC PANEL
CO2: 21 mEq/L (ref 19–32)
Calcium: 9.3 mg/dL (ref 8.4–10.5)
Calcium: 9.1 mg/dL (ref 8.4–10.5)
Chloride: 101 mEq/L (ref 96–112)
GFR calc Af Amer: 24 mL/min — ABNORMAL LOW (ref >90)
GFR calc non Af Amer: 21 mL/min — ABNORMAL LOW (ref >90)
Sodium: 138 mEq/L (ref 135–145)
Sodium: 137 mEq/L (ref 135–145)

## 2012-08-28 LAB — CBC
Platelets: 255 10*3/uL (ref 150–400)
RBC: 5.03 MIL/uL (ref 4.22–5.81)
WBC: 8.9 10*3/uL (ref 4.0–10.5)

## 2012-08-28 MED ORDER — ALLOPURINOL 100 MG PO TABS
100.0000 mg | ORAL_TABLET | Freq: Every day | ORAL | Status: DC
  Administered 2012-08-28: 100 mg via ORAL
  Filled 2012-08-28 (×2): qty 1

## 2012-08-28 MED ORDER — HYDRALAZINE HCL 10 MG PO TABS: 20.0000 mg | ORAL_TABLET | Freq: Three times a day (TID) | ORAL | Status: DC

## 2012-08-28 MED ORDER — COLCHICINE 0.6 MG PO TABS: 0.6000 mg | ORAL_TABLET | Freq: Every day | ORAL | Status: DC

## 2012-08-28 MED ORDER — ALLOPURINOL 100 MG PO TABS: 100.0000 mg | ORAL_TABLET | Freq: Every day | ORAL | Status: DC

## 2012-08-28 MED ORDER — ISOSORBIDE MONONITRATE ER 30 MG PO TB24: 30.0000 mg | ORAL_TABLET | Freq: Every day | ORAL | Status: DC

## 2012-08-28 MED ORDER — HYDRALAZINE HCL 10 MG PO TABS
20.0000 mg | ORAL_TABLET | Freq: Three times a day (TID) | ORAL | Status: DC
  Administered 2012-08-28 (×2): 20 mg via ORAL
  Filled 2012-08-28 (×4): qty 2

## 2012-08-28 MED ORDER — AMIODARONE HCL 200 MG PO TABS: ORAL_TABLET | ORAL | Status: DC

## 2012-08-28 MED ORDER — CARVEDILOL 3.125 MG PO TABS: 3.1250 mg | ORAL_TABLET | Freq: Two times a day (BID) | ORAL | Status: DC

## 2012-08-28 MED ORDER — ISOSORBIDE MONONITRATE ER 30 MG PO TB24
30.0000 mg | ORAL_TABLET | Freq: Every day | ORAL | Status: DC
  Administered 2012-08-28: 30 mg via ORAL
  Filled 2012-08-28: qty 1

## 2012-08-28 MED ORDER — FUROSEMIDE 40 MG PO TABS: 40.0000 mg | ORAL_TABLET | Freq: Every day | ORAL | Status: DC

## 2012-08-28 NOTE — Evaluation (Signed)
Occupational Therapy Evaluation Patient Details Name: Derrill Bagnell MRN: [id number] DOB: 06-21-1923 Today's Date: 08/28/2012 Time: 4098-1191 OT Time Calculation (min): 21 min  OT Assessment / Plan / Recommendation Clinical Impression  Pt doing well after being hospitalized due to A fib, SOB with volume overload/dyspnea. Pt at supervison level with ADLs and sup/min guard A with ADL mobility. No further acute or follow up OT needed at this time, OT will sign off. Recommend PT eval for functional mobiliyt/balance safety    OT Assessment  Patient does not need any further OT services    Follow Up Recommendations  No OT follow up    Barriers to Discharge  None    Equipment Recommendations  None recommended by OT    Recommendations for Other Services PT consult  Frequency       Precautions / Restrictions Precautions Precautions: Fall Restrictions Weight Bearing Restrictions: No   Pertinent Vitals/Pain 5/10 R great toe (gout)    ADL  Grooming: Performed;Wash/dry hands;Wash/dry face;Supervision/safety Upper Body Bathing: Simulated;Supervision/safety Lower Body Bathing: Simulated;Supervision/safety Upper Body Dressing: Performed;Supervision/safety Lower Body Dressing: Performed;Supervision/safety Toilet Transfer: Dentist: Regular height toilet;Grab bars Toileting - Clothing Manipulation and Hygiene: Performed;Supervision/safety Where Assessed - Engineer, mining and Hygiene: Standing Tub/Shower Transfer Method: Not assessed Equipment Used: Gait belt    OT Diagnosis:    OT Problem List:   OT Treatment Interventions:     OT Goals    Visit Information  Last OT Received On: 08/28/12    Subjective Data  Subjective: " They told me I can go home today " Patient Stated Goal: To return home   Prior Functioning     Home Living Lives With: Spouse Available Help at Discharge: Family Type of Home: House Home  Access: Stairs to enter Secretary/administrator of Steps: 4 Entrance Stairs-Rails: Right;Left;Can reach both Home Layout: One level Bathroom Shower/Tub: Engineer, manufacturing systems: Standard Home Adaptive Equipment: None Prior Function Level of Independence: Independent Able to Take Stairs?: Yes Driving: Yes Vocation: Retired Musician: No difficulties Dominant Hand: Right         Vision/Perception Vision - History Baseline Vision: Wears glasses all the time Patient Visual Report: No change from baseline Perception Perception: Within Functional Limits   Cognition  Cognition Arousal/Alertness: Awake/alert Behavior During Therapy: WFL for tasks assessed/performed Overall Cognitive Status: Within Functional Limits for tasks assessed    Extremity/Trunk Assessment Right Upper Extremity Assessment RUE ROM/Strength/Tone: WFL for tasks assessed RUE Sensation: WFL - Light Touch RUE Coordination: WFL - gross/fine motor Left Upper Extremity Assessment LUE ROM/Strength/Tone: WFL for tasks assessed LUE Sensation: WFL - Light Touch LUE Coordination: WFL - gross/fine motor     Mobility Bed Mobility Bed Mobility: Supine to Sit;Sitting - Scoot to Edge of Bed;Sit to Supine Supine to Sit: 7: Independent Sitting - Scoot to Edge of Bed: 7: Independent Sit to Supine: 5: Supervision Transfers Transfers: Sit to Stand;Stand to Sit Sit to Stand: 5: Supervision;From bed;From toilet;4: Min guard Stand to Sit: To bed;To toilet;5: Supervision;4: Min guard     Exercise     Balance Balance Balance Assessed: Yes Static Standing Balance Static Standing - Balance Support: No upper extremity supported;During functional activity Static Standing - Level of Assistance: 5: Stand by assistance Dynamic Standing Balance Dynamic Standing - Balance Support: No upper extremity supported;During functional activity Dynamic Standing - Level of Assistance: 5: Stand by assistance    End of Session OT - End of Session Equipment Utilized During Treatment: Gait belt Activity  Tolerance: Patient tolerated treatment well Patient left: in bed;with call bell/phone within reach;with family/visitor present  GO     Galen Manila 08/28/2012, 12:43 PM

## 2012-08-28 NOTE — Progress Notes (Signed)
Patient is A/Ox4. He is 1 person assist and on room air. IV NS lock. MD on call for Dr.McLean was called and notified of patient's telemetry strip.Pt.is a-paced on demand however on telemetry strip on occasion shows pt.'s pacer spike is at the QRS interval instead of the p-wave. No new orders written. Sticky note left for MD to further address in the am.

## 2012-08-28 NOTE — Plan of Care (Signed)
Problem: Phase II Progression Outcomes Goal: Discharge plan established Recommend no follow up OT after acute care d/c

## 2012-08-28 NOTE — Discharge Summary (Signed)
Discharge Summary   Patient ID: Mark Hess MRN: [id number], DOB/AGE: [birth year] 79 y.o. Admit date: 08/25/2012 D/C date:     08/28/2012  Primary Cardiologist: Allred  Primary Discharge Diagnoses:  1. Acute on chronic systolic CHF/NICM - EF 15% by TEE this admission (previously 20-25% in 05/2012) - exacerbation felt triggered by afib 2. Paroxysmal atrial fibrillation - s/p TEE/DCCV 08/27/12 - started on Eliquis 08/2012 - on amiodarone 3. AKI on CKD 4. Gout 5. H/o bradycardia s/p Medtronic pacemaker 2009 - atrial noncapture by telemetry, pacemaker reprogrammed this admission  Secondary Discharge Diagnoses:  1. CAD, Mild per cath in 2009  2. NSVT 3. EtOH abuse 4. Diabetes mellitus 5. HTN 6. Tobacco abuse 7. COPD 8. GERD 9. Dizziness, Chronic   Hospital Course:  Mark Hess is an 79 y/o M with history of PAF, NICM/chronic systolic CHF who presented to [facility] with SOB and orthopnea. He was recently seen in clinic August 22, 2012 with complaints of progressive fatigue and reduced exercise tolerance and found to be in a rate-controlled atrial fibrillation. This was felt to be causing his symptoms. Dr. Johney Frame started Eliquis and increased his amiodarone to 200mg  BID. Following this appointment, the patient unfortunately began to experience worsening dyspnea, orthopnea, appetite loss, nausea surrounding his meals, and weight gain. Due to these symptoms he presented to the ER where he was found to be markedly volume overloaded with pBNP greater than 30k and CXR c/w pulmonary edema. It was felt that the exacerbation was triggered by going back into atrial fibrillation. He was placed on scheduled IV Lasix, hydralazine & Imdur. He was not put on ACEI due to elevated Cr. His volume status improved with these measures, but on Day 2 his Lasix was cut back due to Cr jump from 2.34 to 2.84. He was also treated with colchicine for gout. Once his volume had improved, he underwent TEE on  08/27/12 (demonstrating no LAA thrombus) and was successfully cardioverted to NSR. TEE showed an EF of 15%. This morning he is feeling better. His Cr has come down to 2.64. Weight has gone from 138lb to 126lb. Pacer spikes were noted on the QRS on telemetry, thus pacemaker was interrogated demonstrating some atrial noncapture so it has been reprogrammed. This also confirmed that he had been in afib as previously known. Dr. Shirlee Latch has seen and examined him today and feels he is stable for discharge. He recommends amiodarone 200mg  BID x 1 week then decreasing to 200mg  daily, continuing apixaban 2.5mg  BID, allopurinol 100mg  daily (reduced dose due to his kidney function), and colchicine 0.6mg  daily x 1 week. Home Lasix dose will be 40mg  daily. Coreg was increased to 3.125mg  BID. For other medications, please see below. Due to appointment availability, he will see Dr. Shirlee Latch for his post-hospital visit then should continue to follow with Dr. Johney Frame thereafter. He will have a BMET in <1 week as requested.  Discharge Vitals: Blood pressure 112/76, pulse 70, temperature 97.4 F (36.3 C), temperature source Oral, resp. rate 20, height 5\' 9"  (1.753 m), weight 126 lb 15.8 oz (57.6 kg), SpO2 98.00%.  Labs: Lab Results  Component Value Date   WBC 8.9 08/28/2012   HGB 13.8 08/28/2012   HCT 41.5 08/28/2012   MCV 82.5 08/28/2012   PLT 255 08/28/2012    Recent Labs Lab 08/25/12 0815  08/28/12 0835  NA 141  < > 137  K 4.3  < > 4.2  CL 109  < > 101  CO2 20  < >  21  BUN 45*  < > 59*  CREATININE 2.63*  < > 2.64*  CALCIUM 9.4  < > 9.1  PROT 6.5  --   --   BILITOT 0.4  --   --   ALKPHOS 80  --   --   ALT 23  --   --   AST 22  --   --   GLUCOSE 128*  < > 152*  < > = values in this interval not displayed.   Diagnostic Studies/Procedures   Dg Chest Portable 1 View 08/25/2012   *RADIOLOGY REPORT*  Clinical Data: Short of breath  PORTABLE CHEST - 1 VIEW  Comparison: Prior chest x-ray 05/09/2012  Findings: Stable  position of right subclavian approach cardiac rhythm maintenance device with leads projecting over the right atrium and right ventricle.  Slightly increased pulmonary vascular congestion now with mild interstitial edema.  Small right pleural effusion and associated right basilar opacity.  Slightly increased enlargement the cardiopericardial silhouette.  Atherosclerotic calcifications again noted the transverse aorta.  No acute osseous abnormality on this single view.  No pneumothorax.  IMPRESSION:  Mild CHF with increased prominence of the cardiac silhouette, mild interstitial edema and small right pleural effusion with associated right basilar atelectasis.   Original Report Authenticated By: Malachy Moan, M.D.   TEE 08/27/12 - Left ventricle: The estimated ejection fraction was 15%. Diffuse hypokinesis. - Aortic valve: No evidence of vegetation. - Mitral valve: No evidence of vegetation. Severe regurgitation. - Left atrium: The atrium was moderately to severely dilated. No evidence of thrombus in the atrial cavity or appendage. There was mild spontaneous echo contrast ("smoke") in the appendage. - Atrial septum: No defect or patent foramen ovale was identified. - Tricuspid valve: No evidence of vegetation. - Pulmonic valve: No evidence of vegetation.    Discharge Medications     Medication List    STOP taking these medications       aspirin 325 MG tablet      TAKE these medications       allopurinol 100 MG tablet  Commonly known as:  ZYLOPRIM  Take 1 tablet (100 mg total) by mouth daily.     amiodarone 200 MG tablet  Commonly known as:  PACERONE  Starting today (the day you are discharged from the hospital), take one tablet by mouth twice a day for 1 week, then decrease to one tablet daily     apixaban 2.5 MG Tabs tablet  Commonly known as:  ELIQUIS  Take 1 tablet (2.5 mg total) by mouth 2 (two) times daily.     carvedilol 3.125 MG tablet  Commonly known as:  COREG    Take 1 tablet (3.125 mg total) by mouth 2 (two) times daily with a meal.     colchicine 0.6 MG tablet  Take 1 tablet (0.6 mg total) by mouth daily. For 1 week.     CYCLOBENZAPRINE HCL PO  Take 10 mg by mouth at bedtime as needed.     furosemide 40 MG tablet  Commonly known as:  LASIX  Take 1 tablet (40 mg total) by mouth daily.     hydrALAZINE 10 MG tablet  Commonly known as:  APRESOLINE  Take 2 tablets (20 mg total) by mouth every 8 (eight) hours.     isosorbide mononitrate 30 MG 24 hr tablet  Commonly known as:  IMDUR  Take 1 tablet (30 mg total) by mouth daily.     nitroGLYCERIN 0.4 MG SL tablet  Commonly  known as:  NITROSTAT  Place 1 tablet (0.4 mg total) under the tongue every 5 (five) minutes as needed for chest pain.     pantoprazole 40 MG tablet  Commonly known as:  PROTONIX  Take 1 tablet (40 mg total) by mouth daily.     predniSONE 5 MG tablet  Commonly known as:  DELTASONE  Take 5 mg by mouth daily.        Disposition   The patient will be discharged in stable condition to home. Discharge Orders   Future Appointments Provider Department Dept Phone   08/31/2012 8:30 AM [facility] [employer] Main Office [place]) [phone]   09/04/2012 2:30 PM Laurey Morale, MD [facility] Main Office [place]) [phone]   09/17/2012 11:50 AM Beatrice Lecher, PA-C [facility] Main Office [place]) [phone]   11/26/2012 9:20 AM [facility] Main Office [place]) [phone]   Future Orders Complete By Expires     Diet - low sodium heart healthy  As directed     Discharge instructions  As directed     Comments:      Follow up with your primary care doctor for your gout medicines.  For patients with congestive heart failure, we give them these special instructions:  1. Follow a low-salt diet and watch your fluid intake. In general, you should not be taking in more than 2 liters of fluid per day (no more than 8  glasses per day). Some patients are restricted to less than 1.5 liters of fluid per day (no more than 6 glasses per day). This includes sources of water in foods like soup, coffee, tea, milk, etc. 2. Weigh yourself on the same scale at same time of day and keep a log. 3. Call your doctor: (Anytime you feel any of the following symptoms)  - 3-4 pound weight gain in 1-2 days or 2 pounds overnight  - Shortness of breath, with or without a dry hacking cough  - Swelling in the hands, feet or stomach  - If you have to sleep on extra pillows at night in order to breathe   IT IS IMPORTANT TO LET YOUR DOCTOR KNOW EARLY ON IF YOU ARE HAVING SYMPTOMS SO WE CAN HELP YOU!    Increase activity slowly  As directed       Follow-up Information   Follow up with [facility]. (Labwork only - on Friday 08/31/12, may come anytime between 7:30am and 4:45pm.)    Contact information:   [address] [place] [address] [phone]      Follow up with Marca Ancona, MD. (09/04/12 at 2:30pm)    Contact information:   [address] [place] [address] [phone]      Follow up with [name], MD. (For your gout)    Contact information:   [address], [address] [place] [address] [phone]         Duration of Discharge Encounter: Greater than 30 minutes including physician and PA time.  Signed, Ronie Spies PA-C 08/28/2012, 12:43 PM

## 2012-08-28 NOTE — Plan of Care (Signed)
Problem: Phase II Progression Outcomes Goal: Case manager referral Outcome: Completed/Met Date Met:  08/28/12 Nursing visit

## 2012-08-28 NOTE — Progress Notes (Signed)
Patient being d/c home with wife and daughter. D?C instructions reviewed with patient.  IV's removed x 2.  Telemetry d/c.

## 2012-08-28 NOTE — Progress Notes (Signed)
Patient evaluated for community based chronic disease management services with [facility] Care Management Program as a benefit of patient's [employer].  Spoke with patient, wife, and daughter (retired Charity fundraiser) at bedside to explain [facility] Care Management services.  Family has declined services at this time.  Left contact information and [facility] literature at bedside. Made inpatient Case Manager aware that [facility] Care Management consulted. Of note, [facility] Care Management services does not replace or interfere with any services that are arranged by inpatient case management or social work.  For additional questions or referrals please contact Anibal Henderson BSN RN [facility] Liaison at [phone].

## 2012-08-28 NOTE — Progress Notes (Signed)
Patient walked in hallway to end of hall and back to room no issues.  Awaiting med rec. to be redone for d/c home per Lucile Crater, PA.

## 2012-08-28 NOTE — Plan of Care (Signed)
Problem: Phase I Progression Outcomes Goal: EF % per last Echo/documented,Core Reminder form on chart Outcome: Completed/Met Date Met:  08/28/12 EF per last echo on 08/27/12 showed EF of 15%.

## 2012-08-28 NOTE — Progress Notes (Signed)
Patient ID: Mark Hess, male   DOB: Jul 13, 1923, 79 y.o.   MRN: [id number]    SUBJECTIVE: Breathing better.  Cardioverted yesterday to NSR.  No complaints this morning.  Walking to bathroom without problems.  Creatinine better.    Marland Kitchen allopurinol  100 mg Oral Daily  . amiodarone  200 mg Oral BID  . apixaban  2.5 mg Oral BID  . carvedilol  3.125 mg Oral Daily  . colchicine  0.6 mg Oral Daily  . furosemide  40 mg Oral Daily  . hydrALAZINE  20 mg Oral Q8H  . isosorbide mononitrate  30 mg Oral Daily  . pantoprazole  40 mg Oral Daily  . predniSONE  5 mg Oral Daily  . sodium chloride  3 mL Intravenous Q12H      Filed Vitals:   08/27/12 1500 08/27/12 2045 08/28/12 0500 08/28/12 0550  BP: 114/72 116/72  116/71  Pulse: 66 58  65  Temp: 97.3 F (36.3 C) 97.2 F (36.2 C)  97.4 F (36.3 C)  TempSrc: Oral Oral  Oral  Resp: 18 20  20   Height:      Weight:   126 lb 15.8 oz (57.6 kg) 126 lb 15.8 oz (57.6 kg)  SpO2: 100% 100%  99%    Intake/Output Summary (Last 24 hours) at 08/28/12 0810 Last data filed at 08/28/12 0554  Gross per 24 hour  Intake    543 ml  Output    775 ml  Net   -232 ml    LABS: Basic Metabolic Panel:  Recent Labs  40/98/11 0345 08/28/12 0515  NA 140 138  K 4.6 5.5*  CL 103 102  CO2 26 22  GLUCOSE 120* 96  BUN 63* 60*  CREATININE 2.84* 2.62*  CALCIUM 9.7 9.3   Liver Function Tests:  Recent Labs  08/25/12 0815  AST 22  ALT 23  ALKPHOS 80  BILITOT 0.4  PROT 6.5  ALBUMIN 3.3*   No results found for this basename: LIPASE, AMYLASE,  in the last 72 hours CBC:  Recent Labs  08/25/12 0815 08/28/12 0515  WBC 9.1 8.9  NEUTROABS 5.4  --   HGB 11.4* 13.8  HCT 35.5* 41.5  MCV 84.5 82.5  PLT 205 255   Cardiac Enzymes:  Recent Labs  08/25/12 0815  TROPONINI <0.30   BNP: No components found with this basename: POCBNP,  D-Dimer: No results found for this basename: DDIMER,  in the last 72 hours Hemoglobin A1C: No results found for  this basename: HGBA1C,  in the last 72 hours Fasting Lipid Panel: No results found for this basename: CHOL, HDL, LDLCALC, TRIG, CHOLHDL, LDLDIRECT,  in the last 72 hours Thyroid Function Tests:  Recent Labs  08/25/12 1535  TSH 2.901   Anemia Panel: No results found for this basename: VITAMINB12, FOLATE, FERRITIN, TIBC, IRON, RETICCTPCT,  in the last 72 hours  RADIOLOGY: Dg Chest Portable 1 View  08/25/2012   *RADIOLOGY REPORT*  Clinical Data: Short of breath  PORTABLE CHEST - 1 VIEW  Comparison: Prior chest x-ray 05/09/2012  Findings: Stable position of right subclavian approach cardiac rhythm maintenance device with leads projecting over the right atrium and right ventricle.  Slightly increased pulmonary vascular congestion now with mild interstitial edema.  Small right pleural effusion and associated right basilar opacity.  Slightly increased enlargement the cardiopericardial silhouette.  Atherosclerotic calcifications again noted the transverse aorta.  No acute osseous abnormality on this single view.  No pneumothorax.  IMPRESSION:  Mild CHF  with increased prominence of the cardiac silhouette, mild interstitial edema and small right pleural effusion with associated right basilar atelectasis.   Original Report Authenticated By: Malachy Moan, M.D.    PHYSICAL EXAM General: NAD Neck: JVP 7 cm, no thyromegaly or thyroid nodule.  Lungs: Clear to auscultation bilaterally with normal respiratory effort. CV: Nondisplaced PMI.  Heart irregular S1/S2, no S3/S4, no murmur.  No peripheral edema.   Abdomen: Soft, nontender, no hepatosplenomegaly, no distention.  Neurologic: Alert and oriented x 3.  Psych: Normal affect. Extremities: No clubbing or cyanosis.   TELEMETRY: Reviewed telemetry pt in atrial fibrillation with occasional pacing  ASSESSMENT AND PLAN: 79 yo with history of nonischemic cardiomyopathy, chronic atrial fibrillation, and CKD was admitted today with volume overload/dyspnea.   1. CHF: Acute on chronic systolic CHF, EF 16% on TEE. Volume status has improved on exam and weight is down.  Creatinine has risen. It is possible that this exacerbation was triggered by going back into atrial fibrillation.  - Continue current po Lasix.  - Hydralazine 20 tid, Imdur 30 daily for afterload reduction.  - Continue low dose Coreg  - No ACEI with elevated creatinine.  2. Atrial fibrillation: Cardioverted to NSR.   - Continue Coreg and apixaban (dosed 2.5 mg bid).  3. AKI on CKD: Creatinine lower today, 2.6. 4. Gout: Pain in MTP great toe.  Better today.  Continue colchicine 0.6 mg daily x 1 week.  Need to decrease allopurinol to 100 mg daily with renal dysfunction.  5. Occasional pacer spikes noted on QRS.  Will interrogate PCM before discharge. 6. Plan discharge home today with home health if does ok walking in halls.  Will need followup with cardiology in < 1 week with BMET.  Meds: Amiodarone 200 mg bid x 1 week then 200 mg daily; apixaban 2.5 mg bid; Coreg 3.125 mg bid, Lasix 40 mg daily, hydralazine 20 mg tid, Imdur 30 mg daily, allopurinol 100 mg daily, colchicine 0.6 mg daily x 1 week.   Marca Ancona 08/28/2012 8:10 AM

## 2012-08-29 ENCOUNTER — Encounter (HOSPITAL_COMMUNITY): Payer: Self-pay | Admitting: Cardiology

## 2012-08-29 ENCOUNTER — Telehealth: Payer: Self-pay | Admitting: Cardiology

## 2012-08-29 NOTE — Telephone Encounter (Signed)
New problem    [facility]

## 2012-08-29 NOTE — Telephone Encounter (Signed)
Spoke with Rayfield Citizen. Told her OK to draw post hospital BMET scheduled for Friday and fax results to our office.

## 2012-08-30 ENCOUNTER — Encounter: Payer: Self-pay | Admitting: *Deleted

## 2012-08-30 ENCOUNTER — Telehealth: Payer: Self-pay | Admitting: Cardiology

## 2012-08-30 ENCOUNTER — Ambulatory Visit (INDEPENDENT_AMBULATORY_CARE_PROVIDER_SITE_OTHER): Payer: Medicare Other | Admitting: *Deleted

## 2012-08-30 VITALS — BP 122/74 | HR 75 | Wt 130.4 lb

## 2012-08-30 DIAGNOSIS — I4891 Unspecified atrial fibrillation: Secondary | ICD-10-CM

## 2012-08-30 NOTE — Progress Notes (Signed)
Pt comes in today b/c he felt like he was back in atrial fib. Also stated he has less energy than when discharged on 08/28/12. His son Gretta Began & daughter-in-law accompany him.  Dr. Jens Som (DOD) reviewed EKG & pt was not out of rhythm. Pt has previously scheduled follow-up with Dr. Shirlee Latch on 09/04/12 & will keep that appt. Mylo Red RN

## 2012-08-30 NOTE — Telephone Encounter (Signed)
Looks like this is Dr. Jenel Lucks patient.  I saw him in the hospital while Allred was on vacation.  Needs to get in office to see Allred or Scott asap.

## 2012-08-30 NOTE — Telephone Encounter (Signed)
New problem   Pt is in afib and not feeling well. Heartbeat is less than 50 per min. Please call pt's son Jed.

## 2012-08-30 NOTE — Telephone Encounter (Signed)
Son calls b/c pt feels he is back in atrial fib & states his heartrate, according to drug store monitor, is between 40-80. States also pt still feeling "low energy" since discharge from hospital 2 days ago. Concerned he is in atrial fib.  Pt will come in today for EKG check Mylo Red RN

## 2012-08-31 ENCOUNTER — Encounter (HOSPITAL_COMMUNITY): Admission: RE | Payer: Self-pay | Source: Ambulatory Visit

## 2012-08-31 ENCOUNTER — Ambulatory Visit (HOSPITAL_COMMUNITY): Admission: RE | Admit: 2012-08-31 | Payer: Medicare Other | Source: Ambulatory Visit | Admitting: Cardiology

## 2012-08-31 ENCOUNTER — Encounter: Payer: Self-pay | Admitting: Cardiology

## 2012-08-31 ENCOUNTER — Other Ambulatory Visit: Payer: Medicare Other

## 2012-08-31 ENCOUNTER — Telehealth: Payer: Self-pay | Admitting: Cardiology

## 2012-08-31 SURGERY — ECHOCARDIOGRAM, TRANSESOPHAGEAL
Anesthesia: Moderate Sedation

## 2012-08-31 NOTE — Telephone Encounter (Signed)
New problem   Pt didn't have his TEE today b/c he was confused and need to be r/s. Please call pt to r/s

## 2012-08-31 NOTE — Telephone Encounter (Signed)
Pt reminded of appt with Dr. Shirlee Latch on Tuesday 6/24.

## 2012-09-03 ENCOUNTER — Ambulatory Visit: Payer: Medicare Other | Admitting: Physician Assistant

## 2012-09-04 ENCOUNTER — Encounter: Payer: Self-pay | Admitting: Cardiology

## 2012-09-04 ENCOUNTER — Ambulatory Visit (INDEPENDENT_AMBULATORY_CARE_PROVIDER_SITE_OTHER): Payer: Medicare Other | Admitting: Cardiology

## 2012-09-04 ENCOUNTER — Encounter: Payer: Self-pay | Admitting: *Deleted

## 2012-09-04 VITALS — BP 116/66 | HR 66 | Ht 69.0 in | Wt 129.0 lb

## 2012-09-04 DIAGNOSIS — I4891 Unspecified atrial fibrillation: Secondary | ICD-10-CM

## 2012-09-04 DIAGNOSIS — I5022 Chronic systolic (congestive) heart failure: Secondary | ICD-10-CM

## 2012-09-04 DIAGNOSIS — R0602 Shortness of breath: Secondary | ICD-10-CM

## 2012-09-04 DIAGNOSIS — I495 Sick sinus syndrome: Secondary | ICD-10-CM

## 2012-09-04 LAB — HEPATIC FUNCTION PANEL
ALT: 23 U/L (ref 0–53)
Bilirubin, Direct: 0.1 mg/dL (ref 0.0–0.3)
Total Bilirubin: 0.6 mg/dL (ref 0.3–1.2)

## 2012-09-04 LAB — BASIC METABOLIC PANEL
CO2: 26 mEq/L (ref 19–32)
Calcium: 9.6 mg/dL (ref 8.4–10.5)
Glucose, Bld: 127 mg/dL — ABNORMAL HIGH (ref 70–99)
Sodium: 137 mEq/L (ref 135–145)

## 2012-09-04 NOTE — Patient Instructions (Addendum)
Your physician recommends that you have lab work today--liver profile/BMET/BNP/TSH.  Your physician has recommended that you have a Cardioversion (DCCV). Electrical Cardioversion uses a jolt of electricity to your heart either through paddles or wired patches attached to your chest. This is a controlled, usually prescheduled, procedure. Defibrillation is done under light anesthesia in the hospital, and you usually go home the day of the procedure. This is done to get your heart back into a normal rhythm. You are not awake for the procedure. Please see the instruction sheet given to you today. Thursday June 26,2014  Your physician recommends that you schedule a follow-up appointment with Dr Shirlee Latch Wednesday August 20,2014 at 3 PM.

## 2012-09-04 NOTE — Progress Notes (Signed)
Patient ID: Mark Hess, male   DOB: 09/28/1923, 79 y.o.   MRN: [id number] PCP: Dr. Valentina Lucks  79 yo with history of nonischemic cardiomyopathy, CKD, and paroxysmal atrial fibrillation presents for cardiology followup.  He was admitted in 6/14 with progressive exertional dyspnea.  He was in atrial fibrillation with controlled rate.  He was diuresed, and TEE-guided DCCV was done resulting in NSR.  He has been on amiodarone.    Today, he is back in atrial fibrillation.  Most recent creatinine had risen to 2.86.  He feels "bad" in general.  He is short of breath after walking 50-75 yards or walking up a hill.  No chest pain.  No syncope, presyncope, or tachypalpitations.   ECG: Coarse atrial fibrillation with V-pacing.    Labs (6/14): K 5.2, creatinine 2.64 => 2.86, HCT 35.5  PMH: 1. Gout 2. CKD: Baseline creatinine around 2. 3. Chronic systolic CHF: Nonischemic cardiomyopathy.  LHC 2012 with mild CAD.  Most recent study was a TEE (6/14) showing EF 15%, diffuse hypokinesis, severe MR, moderate to severe LV dilation.  4. Paroxysmal atrial fibrillation on apixaban.  5. CAD: LHC (2012) with 50% distal RCA stenosis.  6. Sick sinus syndrome with dual chamber Medtronic pacemaker.  7. HTN 8. Type II diabetes 9. COPD 10. GERD  SH: Lives in [place], married, quit smoking in 2009.  FH: Atrial fibrillation, CAD.   ROS: All systems reviewed and negative except as per HPI.   Current Outpatient Prescriptions  Medication Sig Dispense Refill  . allopurinol (ZYLOPRIM) 100 MG tablet Take 1 tablet (100 mg total) by mouth daily.  30 tablet  0  . amiodarone (PACERONE) 200 MG tablet Starting today (the day you are discharged from the hospital), take one tablet by mouth twice a day for 1 week, then decrease to one tablet daily      . apixaban (ELIQUIS) 2.5 MG TABS tablet Take 1 tablet (2.5 mg total) by mouth 2 (two) times daily.  60 tablet  6  . carvedilol (COREG) 3.125 MG tablet Take 1 tablet (3.125  mg total) by mouth 2 (two) times daily with a meal.  60 tablet  3  . CYCLOBENZAPRINE HCL PO Take 10 mg by mouth at bedtime as needed.      . furosemide (LASIX) 40 MG tablet Take 1 tablet (40 mg total) by mouth daily.  30 tablet  3  . hydrALAZINE (APRESOLINE) 10 MG tablet Take 2 tablets (20 mg total) by mouth every 8 (eight) hours.  180 tablet  3  . isosorbide mononitrate (IMDUR) 30 MG 24 hr tablet Take 1 tablet (30 mg total) by mouth daily.  30 tablet  3  . nitroGLYCERIN (NITROSTAT) 0.4 MG SL tablet Place 1 tablet (0.4 mg total) under the tongue every 5 (five) minutes as needed for chest pain.  25 tablet  3  . predniSONE (DELTASONE) 5 MG tablet Take 5 mg by mouth daily.      Marland Kitchen zolpidem (AMBIEN) 10 MG tablet Take 10 mg by mouth at bedtime as needed for sleep.       No current facility-administered medications for this visit.    BP 116/66  Pulse 66  Ht 5\' 9"  (1.753 m)  Wt 129 lb (58.514 kg)  BMI 19.04 kg/m2  SpO2 97% General: NAD Neck: JVP 7-8 cm, no thyromegaly or thyroid nodule.  Lungs: Clear to auscultation bilaterally with normal respiratory effort. CV: Nondisplaced PMI.  Heart regular S1/S2, no S3/S4, 2/6 HSM at apex.  No peripheral edema.  No carotid bruit.  Normal pedal pulses.  Abdomen: Soft, nontender, no hepatosplenomegaly, no distention.  Skin: Intact without lesions or rashes.  Neurologic: Alert and oriented x 3.  Psych: Normal affect. Extremities: No clubbing or cyanosis.   Assessment/Plan: 1. Atrial fibrillation: Patient is back in atrial fibrillation today after his cardioversion during recent admission.  We talked about the pros and cons of re-attempting cardioversion.  Mr Olden is very symptomatic and does not appear markedly volume overloaded.  We decided to try once more to cardiovert him.  He had TEE prior and has been on apixaban without missing doses, so I think that he can be cardioverted without a TEE.  I will arrange for the cardioversion later this week.  He  will continue apixaban and amiodarone at 200 mg bid (decrease to 200 mg daily a week after DCCV).  Check LFTs and TSH today given amiodarone use.  2. Chronic systolic CHF: EF 21% with severe TR on TEE in 6/14.  He does not appear significantly volume overloaded on exam.  - Continue current Coreg and Lasix doses.  - Continue hydralazine/nitrates (no ACEI with renal dysfunction).  - Check BMET/BNP today (K was borderline high on last BMET).   3. CKD: Patient is above baseline (around 2).  Last check showed creatinine 2.86.  Will need to follow closely.   4. Sick sinus syndrome: Medtronic dual chamber pacemaker.   Marca Ancona 09/04/2012

## 2012-09-06 ENCOUNTER — Ambulatory Visit (HOSPITAL_COMMUNITY)
Admission: RE | Admit: 2012-09-06 | Discharge: 2012-09-06 | Disposition: A | Discharge status: Home / Self Care | Payer: Medicare Other | Source: Ambulatory Visit | Attending: Cardiology | Admitting: Cardiology

## 2012-09-06 ENCOUNTER — Encounter (HOSPITAL_COMMUNITY)
Admission: RE | Disposition: A | Discharge status: Home / Self Care | Payer: Self-pay | Source: Ambulatory Visit | Attending: Cardiology

## 2012-09-06 ENCOUNTER — Encounter (HOSPITAL_COMMUNITY): Payer: Self-pay | Admitting: *Deleted

## 2012-09-06 ENCOUNTER — Encounter (HOSPITAL_COMMUNITY): Payer: Self-pay | Admitting: Anesthesiology

## 2012-09-06 ENCOUNTER — Ambulatory Visit (HOSPITAL_COMMUNITY): Payer: Medicare Other | Admitting: Anesthesiology

## 2012-09-06 DIAGNOSIS — IMO0002 Reserved for concepts with insufficient information to code with codable children: Secondary | ICD-10-CM | POA: Insufficient documentation

## 2012-09-06 DIAGNOSIS — J449 Chronic obstructive pulmonary disease, unspecified: Secondary | ICD-10-CM | POA: Insufficient documentation

## 2012-09-06 DIAGNOSIS — Z950 Presence of cardiac pacemaker: Secondary | ICD-10-CM | POA: Insufficient documentation

## 2012-09-06 DIAGNOSIS — Z8249 Family history of ischemic heart disease and other diseases of the circulatory system: Secondary | ICD-10-CM | POA: Insufficient documentation

## 2012-09-06 DIAGNOSIS — I4891 Unspecified atrial fibrillation: Secondary | ICD-10-CM

## 2012-09-06 DIAGNOSIS — I495 Sick sinus syndrome: Secondary | ICD-10-CM | POA: Insufficient documentation

## 2012-09-06 DIAGNOSIS — Z79899 Other long term (current) drug therapy: Secondary | ICD-10-CM | POA: Insufficient documentation

## 2012-09-06 DIAGNOSIS — I5022 Chronic systolic (congestive) heart failure: Secondary | ICD-10-CM | POA: Insufficient documentation

## 2012-09-06 DIAGNOSIS — I428 Other cardiomyopathies: Secondary | ICD-10-CM | POA: Insufficient documentation

## 2012-09-06 DIAGNOSIS — I2510 Atherosclerotic heart disease of native coronary artery without angina pectoris: Secondary | ICD-10-CM | POA: Insufficient documentation

## 2012-09-06 DIAGNOSIS — Z87891 Personal history of nicotine dependence: Secondary | ICD-10-CM | POA: Insufficient documentation

## 2012-09-06 DIAGNOSIS — N189 Chronic kidney disease, unspecified: Secondary | ICD-10-CM | POA: Insufficient documentation

## 2012-09-06 DIAGNOSIS — Z7901 Long term (current) use of anticoagulants: Secondary | ICD-10-CM | POA: Insufficient documentation

## 2012-09-06 DIAGNOSIS — I129 Hypertensive chronic kidney disease with stage 1 through stage 4 chronic kidney disease, or unspecified chronic kidney disease: Secondary | ICD-10-CM | POA: Insufficient documentation

## 2012-09-06 DIAGNOSIS — J4489 Other specified chronic obstructive pulmonary disease: Secondary | ICD-10-CM | POA: Insufficient documentation

## 2012-09-06 DIAGNOSIS — K219 Gastro-esophageal reflux disease without esophagitis: Secondary | ICD-10-CM | POA: Insufficient documentation

## 2012-09-06 DIAGNOSIS — I509 Heart failure, unspecified: Secondary | ICD-10-CM | POA: Insufficient documentation

## 2012-09-06 HISTORY — PX: CARDIOVERSION: SHX1299

## 2012-09-06 SURGERY — CARDIOVERSION
Anesthesia: General

## 2012-09-06 MED ORDER — SODIUM CHLORIDE 0.9 % IV SOLN: 250.0000 mL | INTRAVENOUS | Status: DC

## 2012-09-06 MED ORDER — SODIUM CHLORIDE 0.9 % IJ SOLN: 3.0000 mL | INTRAMUSCULAR | Status: DC | PRN

## 2012-09-06 MED ORDER — FENTANYL CITRATE 0.05 MG/ML IJ SOLN
INTRAMUSCULAR | Status: AC
  Filled 2012-09-06: qty 4

## 2012-09-06 MED ORDER — SODIUM CHLORIDE 0.9 % IJ SOLN: 3.0000 mL | Freq: Two times a day (BID) | INTRAMUSCULAR | Status: DC

## 2012-09-06 MED ORDER — ETOMIDATE 2 MG/ML IV SOLN
INTRAVENOUS | Status: DC | PRN
  Administered 2012-09-06: 12 mg via INTRAVENOUS

## 2012-09-06 MED ORDER — MIDAZOLAM HCL 5 MG/ML IJ SOLN
INTRAMUSCULAR | Status: AC
  Filled 2012-09-06: qty 3

## 2012-09-06 NOTE — Transfer of Care (Signed)
Immediate Anesthesia Transfer of Care Note  Patient: Mark Hess  Procedure(s) Performed: Procedure(s): CARDIOVERSION (N/A)  Patient Location: Endoscopy Unit  Anesthesia Type:MAC  Level of Consciousness: awake, alert  and oriented  Airway & Oxygen Therapy: Patient Spontanous Breathing  Post-op Assessment: Report given to PACU RN  Post vital signs: Reviewed and stable  Complications: No apparent anesthesia complications

## 2012-09-06 NOTE — Anesthesia Preprocedure Evaluation (Addendum)
Anesthesia Evaluation  Patient identified by MRN, date of birth, ID band Patient awake    Reviewed: Allergy & Precautions, NPO status   Airway Mallampati: II  Neck ROM: Full    Dental  (+) Upper Dentures, Poor Dentition, Chipped and Partial Lower,    Pulmonary COPD breath sounds clear to auscultation        Cardiovascular hypertension, + CAD + dysrhythmias + pacemaker Rhythm:Irregular Rate:Normal     Neuro/Psych    GI/Hepatic GERD-  ,  Endo/Other    Renal/GU      Musculoskeletal   Abdominal   Peds  Hematology   Anesthesia Other Findings   Reproductive/Obstetrics                          Anesthesia Physical Anesthesia Plan  ASA: III  Anesthesia Plan: General   Post-op Pain Management:    Induction: Intravenous  Airway Management Planned: Mask  Additional Equipment:   Intra-op Plan:   Post-operative Plan:   Informed Consent:   Plan Discussed with: CRNA and Surgeon  Anesthesia Plan Comments:         Anesthesia Quick Evaluation

## 2012-09-06 NOTE — Procedures (Signed)
Electrical Cardioversion Procedure Note Mark Hess [id number] 1923-10-29  Procedure: Electrical Cardioversion Indications:  Atrial Fibrillation  Procedure Details Consent: Risks of procedure as well as the alternatives and risks of each were explained to the (patient/caregiver).  Consent for procedure obtained. Time Out: Verified patient identification, verified procedure, site/side was marked, verified correct patient position, special equipment/implants available, medications/allergies/relevent history reviewed, required imaging and test results available.  Performed  Patient placed on cardiac monitor, pulse oximetry, supplemental oxygen as necessary.  Sedation given: Patient sedated by anesthesia with amidate 12 mg IV. Pacer pads placed anterior and posterior chest.  Cardioverted 1 time(s).  Cardioverted at 120J.  Evaluation Findings: Post procedure EKG shows: NSR with ventricular pacing. Complications: None Patient did tolerate procedure well.   Mark Hess 09/06/2012, 12:20 PM

## 2012-09-06 NOTE — Anesthesia Postprocedure Evaluation (Signed)
  Anesthesia Post-op Note  Patient: Mark Hess  Procedure(s) Performed: Procedure(s): CARDIOVERSION (N/A)  Patient Location: Endoscopy Unit  Anesthesia Type:MAC  Level of Consciousness: awake, alert  and oriented  Airway and Oxygen Therapy: Patient Spontanous Breathing  Post-op Pain: none  Post-op Assessment: Post-op Vital signs reviewed, Patient's Cardiovascular Status Stable and Respiratory Function Stable  Post-op Vital Signs: Reviewed and stable  Complications: No apparent anesthesia complications

## 2012-09-06 NOTE — H&P (Signed)
Laurey Morale, MD Physician Signed  Progress Notes Service date: 09/04/2012 11:31 PM  Related encounter: Office Visit from 09/04/2012 in [facility] Main Office [facility])   Patient ID: Mark Hess, male DOB: 1924/02/03, 79 y.o. MRN: [id number]  PCP: Dr. Valentina Lucks  79 yo with history of nonischemic cardiomyopathy, CKD, and paroxysmal atrial fibrillation presents for cardiology followup. He was admitted in 6/14 with progressive exertional dyspnea. He was in atrial fibrillation with controlled rate. He was diuresed, and TEE-guided DCCV was done resulting in NSR. He has been on amiodarone.  Today, he is back in atrial fibrillation. Most recent creatinine had risen to 2.86. He feels "bad" in general. He is short of breath after walking 50-75 yards or walking up a hill. No chest pain. No syncope, presyncope, or tachypalpitations.  ECG: Coarse atrial fibrillation with V-pacing.  Labs (6/14): K 5.2, creatinine 2.64 => 2.86, HCT 35.5  PMH:  1. Gout  2. CKD: Baseline creatinine around 2.  3. Chronic systolic CHF: Nonischemic cardiomyopathy. LHC 2012 with mild CAD. Most recent study was a TEE (6/14) showing EF 15%, diffuse hypokinesis, severe MR, moderate to severe LV dilation.  4. Paroxysmal atrial fibrillation on apixaban.  5. CAD: LHC (2012) with 50% distal RCA stenosis.  6. Sick sinus syndrome with dual chamber Medtronic pacemaker.  7. HTN  8. Type II diabetes  9. COPD  10. GERD  SH: Lives in [place], married, quit smoking in 2009.  FH: Atrial fibrillation, CAD.  ROS: All systems reviewed and negative except as per HPI.    Current Outpatient Prescriptions    Medication  Sig  Dispense  Refill    .  allopurinol (ZYLOPRIM) 100 MG tablet  Take 1 tablet (100 mg total) by mouth daily.  30 tablet  0    .  amiodarone (PACERONE) 200 MG tablet  Starting today (the day you are discharged from the hospital), take one tablet by mouth twice a day for 1 week, then decrease to one tablet daily       .  apixaban (ELIQUIS) 2.5 MG TABS tablet  Take 1 tablet (2.5 mg total) by mouth 2 (two) times daily.  60 tablet  6    .  carvedilol (COREG) 3.125 MG tablet  Take 1 tablet (3.125 mg total) by mouth 2 (two) times daily with a meal.  60 tablet  3    .  CYCLOBENZAPRINE HCL PO  Take 10 mg by mouth at bedtime as needed.      .  furosemide (LASIX) 40 MG tablet  Take 1 tablet (40 mg total) by mouth daily.  30 tablet  3    .  hydrALAZINE (APRESOLINE) 10 MG tablet  Take 2 tablets (20 mg total) by mouth every 8 (eight) hours.  180 tablet  3    .  isosorbide mononitrate (IMDUR) 30 MG 24 hr tablet  Take 1 tablet (30 mg total) by mouth daily.  30 tablet  3    .  nitroGLYCERIN (NITROSTAT) 0.4 MG SL tablet  Place 1 tablet (0.4 mg total) under the tongue every 5 (five) minutes as needed for chest pain.  25 tablet  3    .  predniSONE (DELTASONE) 5 MG tablet  Take 5 mg by mouth daily.      Marland Kitchen  zolpidem (AMBIEN) 10 MG tablet  Take 10 mg by mouth at bedtime as needed for sleep.      No current facility-administered medications for this visit.  BP 116/66  Pulse 66  Ht 5\' 9"  (1.753 m)  Wt 129 lb (58.514 kg)  BMI 19.04 kg/m2  SpO2 97%  General: NAD  Neck: JVP 7-8 cm, no thyromegaly or thyroid nodule.  Lungs: Clear to auscultation bilaterally with normal respiratory effort.  CV: Nondisplaced PMI. Heart regular S1/S2, no S3/S4, 2/6 HSM at apex. No peripheral edema. No carotid bruit. Normal pedal pulses.  Abdomen: Soft, nontender, no hepatosplenomegaly, no distention.  Skin: Intact without lesions or rashes.  Neurologic: Alert and oriented x 3.  Psych: Normal affect.  Extremities: No clubbing or cyanosis.  Assessment/Plan:  1. Atrial fibrillation: Patient is back in atrial fibrillation today after his cardioversion during recent admission. We talked about the pros and cons of re-attempting cardioversion. Mr Escoto is very symptomatic and does not appear markedly volume overloaded. We decided to try once more  to cardiovert him. He had TEE prior and has been on apixaban without missing doses, so I think that he can be cardioverted without a TEE. I will arrange for the cardioversion later this week. He will continue apixaban and amiodarone at 200 mg bid (decrease to 200 mg daily a week after DCCV). Check LFTs and TSH today given amiodarone use.  2. Chronic systolic CHF: EF 09% with severe TR on TEE in 6/14. He does not appear significantly volume overloaded on exam.  - Continue current Coreg and Lasix doses.  - Continue hydralazine/nitrates (no ACEI with renal dysfunction).  - Check BMET/BNP today (K was borderline high on last BMET).  3. CKD: Patient is above baseline (around 2). Last check showed creatinine 2.86. Will need to follow closely.  4. Sick sinus syndrome: Medtronic dual chamber pacemaker.  Marca Ancona  09/04/2012     For DCCV; no changes. [name]

## 2012-09-06 NOTE — Preoperative (Signed)
Beta Blockers   Reason not to administer Beta Blockers:Not Applicable 

## 2012-09-10 ENCOUNTER — Encounter (HOSPITAL_COMMUNITY): Payer: Self-pay | Admitting: Cardiology

## 2012-09-11 ENCOUNTER — Encounter: Payer: Self-pay | Admitting: Cardiology

## 2012-09-12 ENCOUNTER — Encounter: Payer: Medicare Other | Admitting: Internal Medicine

## 2012-09-13 ENCOUNTER — Other Ambulatory Visit: Payer: Self-pay | Admitting: *Deleted

## 2012-09-13 DIAGNOSIS — E875 Hyperkalemia: Secondary | ICD-10-CM

## 2012-09-17 ENCOUNTER — Ambulatory Visit (INDEPENDENT_AMBULATORY_CARE_PROVIDER_SITE_OTHER): Payer: Medicare Other | Admitting: Physician Assistant

## 2012-09-17 ENCOUNTER — Other Ambulatory Visit: Payer: Medicare Other

## 2012-09-17 ENCOUNTER — Telehealth: Payer: Self-pay | Admitting: *Deleted

## 2012-09-17 ENCOUNTER — Encounter: Payer: Self-pay | Admitting: Physician Assistant

## 2012-09-17 VITALS — BP 99/63 | HR 74 | Ht 69.0 in | Wt 128.0 lb

## 2012-09-17 DIAGNOSIS — I4891 Unspecified atrial fibrillation: Secondary | ICD-10-CM

## 2012-09-17 DIAGNOSIS — N184 Chronic kidney disease, stage 4 (severe): Secondary | ICD-10-CM

## 2012-09-17 DIAGNOSIS — I10 Essential (primary) hypertension: Secondary | ICD-10-CM

## 2012-09-17 DIAGNOSIS — I5022 Chronic systolic (congestive) heart failure: Secondary | ICD-10-CM

## 2012-09-17 DIAGNOSIS — R079 Chest pain, unspecified: Secondary | ICD-10-CM

## 2012-09-17 LAB — BASIC METABOLIC PANEL
BUN: 43 mg/dL — ABNORMAL HIGH (ref 6–23)
CO2: 30 mEq/L (ref 19–32)
Chloride: 100 mEq/L (ref 96–112)
GFR: 22.1 mL/min — ABNORMAL LOW (ref >60.00)
Glucose, Bld: 110 mg/dL — ABNORMAL HIGH (ref 70–99)
Potassium: 4.5 mEq/L (ref 3.5–5.1)
Sodium: 135 mEq/L (ref 135–145)

## 2012-09-17 MED ORDER — ISOSORBIDE MONONITRATE ER 30 MG PO TB24: 15.0000 mg | ORAL_TABLET | Freq: Every day | ORAL | Status: DC

## 2012-09-17 NOTE — Telephone Encounter (Signed)
Message copied by Tarri Fuller on Mon Sep 17, 2012  5:52 PM ------      Message from: [place], [place] T      Created: Mon Sep 17, 2012  4:51 PM       K+ ok      Creatinine stable.      Continue with current treatment plan.      Keep f/u with Dr. Marca Ancona in August      Scott Weaver, PA-C        09/17/2012 4:51 PM ------

## 2012-09-17 NOTE — Telephone Encounter (Signed)
pt notified about lab results with verbal understanding  

## 2012-09-17 NOTE — Patient Instructions (Addendum)
KEEP YOUR APPT IN 10/2012 WITH DR. Shirlee Latch  DECREASE IMDUR TO 15 MG DAILY (1/2 TAB DAILY)  LAB TODAY BMET

## 2012-09-17 NOTE — Progress Notes (Signed)
[address], [address] [place], [place]  [address] Phone: [phone] Fax:  [phone]  Date:  09/17/2012   ID:  Mark Hess, DOB [birth year], MRN [id number]  PCP:  [name], MD  Cardiologist:  Dr. Marca Ancona   Electrophysiologist:  Dr. Hillis Range    History of Present Illness: Mark Hess is a 79 y.o. male who returns for f/u after a recent DCCV.  He has a history of systolic CHF, NICM, CAD, AFib previously maintaining sinus rhythm on amiodarone, s/p pacemaker, NSVT, DM2, HTN, COPD. LHC 4/09: EF 15-20%, distal RCA 50%, LAD irregularities, circumflex irregularities. Echocardiogram 4/09: EF 20%. TEE 4/09: EF 35-40%.  I saw him 04/2012 with c/o's CP and DOE. Symptoms were concerning for angina. I saw him with Dr. Johney Frame and we thought he was not a good candidate for cardiac cath. We decided to treat him medically. We started coreg and imdur. Labs indicated hyperkalemia and his ACE was stopped. Creatinine remained stable and his K+ improved. Echo was done and demonstrated stable EF at 20-25% and diffuse HK and mod MR.  He was admitted in 08/2012 with progressive DOE.  He was in AFib with CVR.  He was diuresed and underwent TEE-DCCV restoring NSR.  Last seen by Dr. Marca Ancona 09/04/12.  He felt poorly and noted worsening DOE.  He was back in AFib.  He is anticoagulated with Apixaban.  It was decided to proceed with DCCV again to try and restore NSR.  DCCV done 6/26 with restoration of NSR.  He was to decrease Amiodarone to QD 1 week after his DCCV.  Of note, pre-DCCV, his creatinine was higher and his Lasix was reduced.   He overall feels better.  Feels like his breathing is better.  He has an occasional chest ache.  It is non-exertional.  No orthopnea, PND, edema.  No syncope.  Saw PCP recently for a cough.  Nasal steroids were Rx and it is getting better.     Labs (12/13): K 4.8, creatinine 2.3, ALT 18, TSH 0.13  Labs (2/14):   K 5.9 => 5.4, creatinine 2.4 =>  2.6, BNP 944 => 636, Hgb 13.3  Labs (3/14):   K 4.2, creatinine 2.8 Labs (6/14):   K5.2=>4.9, Cr 2.64=>2.86=>3.4, ALT 23, BNP 1016, TSH 2.48 Labs (7/14):   K 5.1, Cr 2.92  Wt Readings from Last 3 Encounters:  09/17/12 128 lb (58.06 kg)  09/04/12 129 lb (58.514 kg)  08/30/12 130 lb 6.4 oz (59.149 kg)     Past Medical History  Diagnosis Date  . Chronic systolic heart failure     a. EF 30 to 35% per echo in Jan 2011; b. Echo 3/14:  mild LVH, EF 20-25%, diff HK, mod MR, mod LAE, PASP 48. c. EF 15% by TEE 08/2012  . Coronary artery disease     Mild CAD per cath in 2009  . Paroxysmal atrial fibrillation     a. on amio w/ recurrent afib 08/2012;  b. eliquis started 08/2012. c. s/p TEE/DCCV 08/2012.  . Nonsustained ventricular tachycardia   . Alcohol abuse   . Hypertension   . Tobacco abuse     quit  . COPD (chronic obstructive pulmonary disease)   . GERD (gastroesophageal reflux disease)   . Gout   . Dizziness     Chronic  . Nonischemic cardiomyopathy   . Bradycardia     a. s/p Medtronic pacemaker 2009  . CKD (chronic kidney disease)     Current  Outpatient Prescriptions  Medication Sig Dispense Refill  . allopurinol (ZYLOPRIM) 100 MG tablet Take 1 tablet (100 mg total) by mouth daily.  30 tablet  0  . amiodarone (PACERONE) 200 MG tablet Starting today (the day you are discharged from the hospital), take one tablet by mouth twice a day for 1 week, then decrease to one tablet daily      . apixaban (ELIQUIS) 2.5 MG TABS tablet Take 1 tablet (2.5 mg total) by mouth 2 (two) times daily.  60 tablet  6  . carvedilol (COREG) 3.125 MG tablet Take 1 tablet (3.125 mg total) by mouth 2 (two) times daily with a meal.  60 tablet  3  . CYCLOBENZAPRINE HCL PO Take 10 mg by mouth at bedtime as needed.      . furosemide (LASIX) 40 MG tablet Take 1 tablet (40 mg total) by mouth daily.  30 tablet  3  . hydrALAZINE (APRESOLINE) 10 MG tablet Take 2 tablets (20 mg total) by mouth every 8 (eight) hours.  180  tablet  3  . isosorbide mononitrate (IMDUR) 30 MG 24 hr tablet Take 1 tablet (30 mg total) by mouth daily.  30 tablet  3  . nitroGLYCERIN (NITROSTAT) 0.4 MG SL tablet Place 1 tablet (0.4 mg total) under the tongue every 5 (five) minutes as needed for chest pain.  25 tablet  3  . predniSONE (DELTASONE) 5 MG tablet Take 1 mg by mouth daily.       Marland Kitchen zolpidem (AMBIEN) 10 MG tablet Take 10 mg by mouth at bedtime as needed for sleep.       No current facility-administered medications for this visit.    Allergies:   No Known Allergies  Social History:  The patient  reports that he quit smoking about 5 years ago. His smokeless tobacco use includes Chew. He reports that he does not drink alcohol or use illicit drugs.   ROS:  Please see the history of present illness.      All other systems reviewed and negative.   PHYSICAL EXAM: VS:  BP 99/63  Pulse 74  Ht 5\' 9"  (1.753 m)  Wt 128 lb (58.06 kg)  BMI 18.89 kg/m2 Well nourished, well developed, in no acute distress HEENT: normal Neck: no JVD Cardiac:  normal S1, S2; RRR; no murmur Lungs:  clear to auscultation bilaterally, no wheezing, rhonchi or rales Abd: soft, nontender, no hepatomegaly Ext: no edema Skin: warm and dry Neuro:  CNs 2-12 intact, no focal abnormalities noted  EKG:  A paced, HR 74     ASSESSMENT AND PLAN:  1. Atrial Fibrillation:  Maintaining NSR.  He is back on Amiodarone QD.  Continue Eliquis.   2. Chronic Systolic CHF:  Volume appears stable.  Continue current Rx.  Check BMET today. 3. CKD:  Repeat BMET today. 4. Hypertension:  BP somewhat soft.  ? If this is making him weak.  Will cut back on Imdur to 15 mg QD.  May need to cut back on hydralazine to 10 mg TID if BP no better. 5. Disposition:  F/u with Dr. Marca Ancona as planned.   Signed, Tereso Newcomer, PA-C  09/17/2012 12:18 PM

## 2012-10-31 ENCOUNTER — Ambulatory Visit (INDEPENDENT_AMBULATORY_CARE_PROVIDER_SITE_OTHER): Payer: Medicare Other | Admitting: Cardiology

## 2012-10-31 ENCOUNTER — Encounter: Payer: Self-pay | Admitting: Cardiology

## 2012-10-31 VITALS — BP 97/65 | HR 74 | Ht 69.0 in | Wt 138.0 lb

## 2012-10-31 DIAGNOSIS — N184 Chronic kidney disease, stage 4 (severe): Secondary | ICD-10-CM

## 2012-10-31 DIAGNOSIS — I4891 Unspecified atrial fibrillation: Secondary | ICD-10-CM

## 2012-10-31 DIAGNOSIS — R0789 Other chest pain: Secondary | ICD-10-CM

## 2012-10-31 DIAGNOSIS — I5022 Chronic systolic (congestive) heart failure: Secondary | ICD-10-CM

## 2012-10-31 NOTE — Patient Instructions (Addendum)
Increase lasix(furosmide) to 40mg  in the AM and 20mg  in the PM. This will be 1 of your 40mg  tablets in the AM and 1/2 of a 20mg  tablet in the  PM.  Your physician recommends that you return for lab work in: 1 week--BMET/BNP/Liver profile/TSH.  Your physician recommends that you schedule a follow-up appointment in: 4-6 weeks with Dr Shirlee Latch. This is scheduled for Wednesday October 1,2014 at 10:45 AM.

## 2012-11-01 NOTE — Progress Notes (Signed)
Patient ID: Mark Hess, male   DOB: 1923-10-02, 79 y.o.   MRN: [id number] PCP: Dr. Valentina Lucks  79 yo with history of nonischemic cardiomyopathy, CKD, and paroxysmal atrial fibrillation presents for cardiology followup.  He was admitted in 6/14 with progressive exertional dyspnea.  He was in atrial fibrillation with controlled rate.  He was diuresed, and TEE-guided DCCV was done resulting in NSR.  He has been on amiodarone.  In 6/14, he was back in atrial fibrillation and felt "bad" with significant dyspnea.  I cardioverted him again in 6/14 with reloading of amiodarone.    Today, he is a-paced and v-sensed.  Most recent creatinine was stable at 2.9.  He has been eating a lot better (per his daughter) and weight is up.  He has had some lower extremity edema for about a week.  He is short of breath walking up hills or up steps.  He thinks that the cardioversion has helped some.  He is not short of breath walking on flat ground.  He gets occasional chest discomfort (mild) with heavy exertion.  He gets bilateral leg pain with ambulation as well as back pain.   ECG: a-paced, prolonged AV delay with native QRS complex and IVCD   Labs (6/14): K 5.2, creatinine 2.64 => 2.86, HCT 35.5 Labs (7/14): K 4.5, creatinine 2.9  PMH: 1. Gout 2. CKD: Baseline creatinine around 2. 3. Chronic systolic CHF: Nonischemic cardiomyopathy.  LHC 2012 with mild CAD.  Most recent study was a TEE (6/14) showing EF 15%, diffuse hypokinesis, severe MR, moderate to severe LV dilation.  4. Paroxysmal atrial fibrillation on apixaban. DCCV in 6/14.  5. CAD: LHC (2012) with 50% distal RCA stenosis.  6. Sick sinus syndrome with dual chamber Medtronic pacemaker.  7. HTN 8. Type II diabetes 9. COPD 10. GERD  SH: Lives in [place], married, quit smoking in 2009.  FH: Atrial fibrillation, CAD.   ROS: All systems reviewed and negative except as per HPI.   Current Outpatient Prescriptions  Medication Sig Dispense Refill  .  allopurinol (ZYLOPRIM) 100 MG tablet Take 1 tablet (100 mg total) by mouth daily.  30 tablet  0  . amiodarone (PACERONE) 200 MG tablet Starting today (the day you are discharged from the hospital), take one tablet by mouth twice a day for 1 week, then decrease to one tablet daily      . apixaban (ELIQUIS) 2.5 MG TABS tablet Take 1 tablet (2.5 mg total) by mouth 2 (two) times daily.  60 tablet  6  . carvedilol (COREG) 3.125 MG tablet Take 1 tablet (3.125 mg total) by mouth 2 (two) times daily with a meal.  60 tablet  3  . CYCLOBENZAPRINE HCL PO Take 10 mg by mouth at bedtime as needed.      . hydrALAZINE (APRESOLINE) 10 MG tablet Take 2 tablets (20 mg total) by mouth every 8 (eight) hours.  180 tablet  3  . nitroGLYCERIN (NITROSTAT) 0.4 MG SL tablet Place 1 tablet (0.4 mg total) under the tongue every 5 (five) minutes as needed for chest pain.  25 tablet  3  . predniSONE (DELTASONE) 5 MG tablet Take 1 mg by mouth daily.       . traZODone (DESYREL) 50 MG tablet Take 50 mg by mouth at bedtime. Take 2 tablets as needed for sleep      . furosemide (LASIX) 40 MG tablet 1 tablet (total 40mg ) in the AM and 1/2 tablet (total 20mg  ) in the PM  45 tablet  6   No current facility-administered medications for this visit.    BP 97/65  Pulse 74  Ht 5\' 9"  (1.753 m)  Wt 62.596 kg (138 lb)  BMI 20.37 kg/m2 General: NAD Neck: JVP 8-9 cm, no thyromegaly or thyroid nodule.  Lungs: Clear to auscultation bilaterally with normal respiratory effort. CV: Nondisplaced PMI.  Heart regular S1/S2, no S3/S4, 2/6 HSM at apex.  No peripheral edema.  No carotid bruit.  Normal pedal pulses.  Abdomen: Soft, nontender, no hepatosplenomegaly, no distention.  Skin: Intact without lesions or rashes.  Neurologic: Alert and oriented x 3.  Psych: Normal affect. Extremities: No clubbing or cyanosis.   Assessment/Plan: 1. Atrial fibrillation: Patient is not in atrial fibrillation today.  He was cardioverted in 6/14.  He will  continue apixaban (reduced dose), amiodarone, and Coreg.  He will need TSH and LFTs given amiodarone use.  He should have a yearly eye exam.  2. Chronic systolic CHF: EF 14% with severe TR on TEE in 6/14.  He does appear volume overloaded on exam with NYHA class III symptoms.  Diuresis is going to be complicated by his renal function. - Increase Lasix to 40 qam, 20 qpm with BMET/BNP in 1 week.  - Continue hydralazine/nitrates (no ACEI with renal dysfunction), continue Coreg.  I will not titrate these meds today with marginal blood pressure. .  - Check BMET/BNP today (K was borderline high on last BMET).   3. CKD: Follow closely with diuresis.  Last creatinine 2.9.  4. Sick sinus syndrome: Medtronic dual chamber pacemaker.   Followup with me in 1 month.   Marca Ancona 11/01/2012

## 2012-11-05 ENCOUNTER — Other Ambulatory Visit (INDEPENDENT_AMBULATORY_CARE_PROVIDER_SITE_OTHER): Payer: Medicare Other

## 2012-11-05 DIAGNOSIS — I4891 Unspecified atrial fibrillation: Secondary | ICD-10-CM

## 2012-11-05 DIAGNOSIS — R0602 Shortness of breath: Secondary | ICD-10-CM

## 2012-11-05 LAB — BASIC METABOLIC PANEL
Chloride: 106 mEq/L (ref 96–112)
Creatinine, Ser: 3 mg/dL — ABNORMAL HIGH (ref 0.4–1.5)
Potassium: 4.3 mEq/L (ref 3.5–5.1)
Sodium: 136 mEq/L (ref 135–145)

## 2012-11-05 LAB — HEPATIC FUNCTION PANEL
AST: 18 U/L (ref 0–37)
Total Bilirubin: 0.6 mg/dL (ref 0.3–1.2)

## 2012-11-05 LAB — TSH: TSH: 1.59 u[IU]/mL (ref 0.35–5.50)

## 2012-11-05 LAB — BRAIN NATRIURETIC PEPTIDE: Pro B Natriuretic peptide (BNP): 1959 pg/mL — ABNORMAL HIGH (ref 0.0–100.0)

## 2012-11-06 ENCOUNTER — Telehealth: Payer: Self-pay | Admitting: *Deleted

## 2012-11-07 ENCOUNTER — Other Ambulatory Visit: Payer: Medicare Other

## 2012-11-07 ENCOUNTER — Telehealth: Payer: Self-pay | Admitting: Cardiology

## 2012-11-07 DIAGNOSIS — I4891 Unspecified atrial fibrillation: Secondary | ICD-10-CM

## 2012-11-07 MED ORDER — FUROSEMIDE 40 MG PO TABS: ORAL_TABLET | ORAL | Status: DC

## 2012-11-07 NOTE — Telephone Encounter (Signed)
New problem   Pt need new prescriptions for Trazadone 100mg  and Lasix 20mg . Sent to [facility] [phone].

## 2012-11-07 NOTE — Telephone Encounter (Signed)
See phone note dated 11/06/12

## 2012-11-07 NOTE — Telephone Encounter (Signed)
Spoke with patient. Pt states he will contact his PCP for recommendations for sleep. Pt is aware I am not sending in a prescription for trazodone and that I am sending in a refill for lasix.

## 2012-11-07 NOTE — Telephone Encounter (Signed)
Reviewed with Dr Shirlee Latch 11/06/12. He will prescribe trazodone 50mg  hs prn sleep #30 x 1, pt should contact PCP for additional refills/management of sleep problems. LMTCB

## 2012-11-23 ENCOUNTER — Telehealth: Payer: Self-pay | Admitting: Cardiology

## 2012-11-23 DIAGNOSIS — I5022 Chronic systolic (congestive) heart failure: Secondary | ICD-10-CM

## 2012-11-23 MED ORDER — FUROSEMIDE 40 MG PO TABS: 40.0000 mg | ORAL_TABLET | Freq: Two times a day (BID) | ORAL | Status: DC

## 2012-11-23 NOTE — Telephone Encounter (Signed)
Lasix to 40 mg bid.  BMET/BNP in 1 week.

## 2012-11-23 NOTE — Telephone Encounter (Signed)
New Problem  Pt was advised that if he gains wt call the heart Dr. ( 1 lbs for 3 consecutive days// feet and legs are swollen)

## 2012-11-23 NOTE — Telephone Encounter (Signed)
Spoke with patient's wife. Pt's weight 9/9 138lbs (baseline per wife). 9/10 139lbs 9/11 140lbs  9/12 141lbs , SOB unchanged, does have increase in lower extremity edema. Usual lasix 40mg  AM, 20mg  PM, no KCL. I will forward to Dr Shirlee Latch for review.

## 2012-11-23 NOTE — Telephone Encounter (Signed)
Pt's wife notified.

## 2012-11-26 ENCOUNTER — Encounter: Payer: Medicare Other | Admitting: *Deleted

## 2012-11-30 ENCOUNTER — Other Ambulatory Visit (INDEPENDENT_AMBULATORY_CARE_PROVIDER_SITE_OTHER): Payer: Medicare Other

## 2012-11-30 ENCOUNTER — Other Ambulatory Visit: Payer: Self-pay | Admitting: *Deleted

## 2012-11-30 DIAGNOSIS — I509 Heart failure, unspecified: Secondary | ICD-10-CM

## 2012-11-30 DIAGNOSIS — I5022 Chronic systolic (congestive) heart failure: Secondary | ICD-10-CM

## 2012-11-30 DIAGNOSIS — Z79899 Other long term (current) drug therapy: Secondary | ICD-10-CM

## 2012-11-30 DIAGNOSIS — R0602 Shortness of breath: Secondary | ICD-10-CM

## 2012-11-30 LAB — BASIC METABOLIC PANEL
CO2: 22 mEq/L (ref 19–32)
Calcium: 9 mg/dL (ref 8.4–10.5)
Chloride: 108 mEq/L (ref 96–112)
Potassium: 3.7 mEq/L (ref 3.5–5.1)
Sodium: 137 mEq/L (ref 135–145)

## 2012-12-03 ENCOUNTER — Encounter: Payer: Self-pay | Admitting: *Deleted

## 2012-12-04 ENCOUNTER — Telehealth: Payer: Self-pay | Admitting: Cardiology

## 2012-12-04 DIAGNOSIS — I5022 Chronic systolic (congestive) heart failure: Secondary | ICD-10-CM

## 2012-12-04 MED ORDER — FUROSEMIDE 40 MG PO TABS: ORAL_TABLET | ORAL | Status: DC

## 2012-12-04 NOTE — Telephone Encounter (Signed)
Spoke with daughter in law about lab and medication changes.

## 2012-12-04 NOTE — Telephone Encounter (Signed)
Follow up:  Daughter n law states she is returning Anne's call regarding pt's lasix medication. Weslee Fogg would like Thurston Hole to call her back at [phone].

## 2012-12-11 ENCOUNTER — Other Ambulatory Visit (INDEPENDENT_AMBULATORY_CARE_PROVIDER_SITE_OTHER): Payer: Medicare Other

## 2012-12-11 DIAGNOSIS — Z79899 Other long term (current) drug therapy: Secondary | ICD-10-CM

## 2012-12-11 DIAGNOSIS — R0602 Shortness of breath: Secondary | ICD-10-CM

## 2012-12-11 DIAGNOSIS — I5023 Acute on chronic systolic (congestive) heart failure: Secondary | ICD-10-CM

## 2012-12-11 DIAGNOSIS — I509 Heart failure, unspecified: Secondary | ICD-10-CM

## 2012-12-11 LAB — BASIC METABOLIC PANEL
BUN: 50 mg/dL — ABNORMAL HIGH (ref 6–23)
CO2: 24 mEq/L (ref 19–32)
Calcium: 9 mg/dL (ref 8.4–10.5)
Chloride: 107 mEq/L (ref 96–112)
Creatinine, Ser: 2.7 mg/dL — ABNORMAL HIGH (ref 0.4–1.5)
GFR: 23.39 mL/min — ABNORMAL LOW (ref >60.00)
Glucose, Bld: 186 mg/dL — ABNORMAL HIGH (ref 70–99)
Glucose, Bld: 183 mg/dL — ABNORMAL HIGH (ref 70–99)
Potassium: 3.3 mEq/L — ABNORMAL LOW (ref 3.5–5.1)
Potassium: 3.3 mEq/L — ABNORMAL LOW (ref 3.5–5.1)
Sodium: 137 mEq/L (ref 135–145)

## 2012-12-11 LAB — BRAIN NATRIURETIC PEPTIDE: Pro B Natriuretic peptide (BNP): 2251 pg/mL — ABNORMAL HIGH (ref 0.0–100.0)

## 2012-12-12 ENCOUNTER — Telehealth: Payer: Self-pay | Admitting: Cardiology

## 2012-12-12 ENCOUNTER — Ambulatory Visit (INDEPENDENT_AMBULATORY_CARE_PROVIDER_SITE_OTHER): Payer: Medicare Other | Admitting: Cardiology

## 2012-12-12 ENCOUNTER — Encounter: Payer: Self-pay | Admitting: Cardiology

## 2012-12-12 ENCOUNTER — Ambulatory Visit (INDEPENDENT_AMBULATORY_CARE_PROVIDER_SITE_OTHER): Payer: Medicare Other | Admitting: *Deleted

## 2012-12-12 ENCOUNTER — Other Ambulatory Visit: Payer: Self-pay | Admitting: Internal Medicine

## 2012-12-12 VITALS — BP 111/61 | HR 81 | Ht 69.0 in | Wt 146.0 lb

## 2012-12-12 DIAGNOSIS — I5022 Chronic systolic (congestive) heart failure: Secondary | ICD-10-CM

## 2012-12-12 DIAGNOSIS — I495 Sick sinus syndrome: Secondary | ICD-10-CM

## 2012-12-12 DIAGNOSIS — I4891 Unspecified atrial fibrillation: Secondary | ICD-10-CM

## 2012-12-12 DIAGNOSIS — N184 Chronic kidney disease, stage 4 (severe): Secondary | ICD-10-CM

## 2012-12-12 MED ORDER — TORSEMIDE 20 MG PO TABS: ORAL_TABLET | ORAL | Status: AC

## 2012-12-12 MED ORDER — ISOSORBIDE MONONITRATE ER 30 MG PO TB24: ORAL_TABLET | ORAL | Status: AC

## 2012-12-12 NOTE — Telephone Encounter (Signed)
Walk In Pt Form " Disability Pla-Card" gave to Thurston Hole L 12/12/12/KM

## 2012-12-12 NOTE — Progress Notes (Signed)
Patient ID: Mark Hess, male   DOB: 09/05/1923, 79 y.o.   MRN: [id number] PCP: Dr. Valentina Lucks  79 yo with history of nonischemic cardiomyopathy, CKD, and paroxysmal atrial fibrillation presents for cardiology followup.  He was admitted in 6/14 with progressive exertional dyspnea.  He was in atrial fibrillation with controlled rate.  He was diuresed, and TEE-guided DCCV was done resulting in NSR.  He has been on amiodarone.  In 6/14, he was back in atrial fibrillation and felt "bad" with significant dyspnea.  I cardioverted him again in 6/14 with reloading of amiodarone.  Today, he is a-paced and v-sensed.  He has a long A-V delay to limit RV pacing.  Most recent creatinine was stable at 2.8.    Since last appointment, he has gained 8 lbs.  He is short of breath after walking about 30 yards, which seems to be a bit worse.  Some orthopnea: sleeps with head propped up.  Mild atypical chest pain, no exertional chest pain.  About 2 wks ago, I increased Lasix to 60 mg bid, but this does not seem to have made much difference.  He also complains of low back pain and bilateral calf pain after walking 30-50 yards.  This has been going on for a few months.   ECG: a-paced, prolonged AV delay with native QRS complex and LBBB with QRS duration 124 mec   Labs (6/14): K 5.2, creatinine 2.64 => 2.86, HCT 35.5 Labs (7/14): K 4.5, creatinine 2.9 Labs (9/14): K 3.3, creatinine 2.8, BNP 225, LFTs normal, TSH normal  PMH: 1. Gout 2. CKD 3. Chronic systolic CHF: Nonischemic cardiomyopathy.  LHC 2012 with mild CAD.  Most recent study was a TEE (6/14) showing EF 15%, diffuse hypokinesis, severe MR, moderate to severe LV dilation.  4. Paroxysmal atrial fibrillation on apixaban. DCCV in 6/14.  5. CAD: LHC (2012) with 50% distal RCA stenosis.  6. Sick sinus syndrome with dual chamber Medtronic pacemaker.  7. HTN 8. Type II diabetes 9. COPD 10. GERD  SH: Lives in [place], married, quit smoking in 2009.  FH:  Atrial fibrillation, CAD.   ROS: All systems reviewed and negative except as per HPI.   Current Outpatient Prescriptions  Medication Sig Dispense Refill  . allopurinol (ZYLOPRIM) 100 MG tablet Take 1 tablet (100 mg total) by mouth daily.  30 tablet  0  . amiodarone (PACERONE) 200 MG tablet Starting today (the day you are discharged from the hospital), take one tablet by mouth twice a day for 1 week, then decrease to one tablet daily      . apixaban (ELIQUIS) 2.5 MG TABS tablet Take 1 tablet (2.5 mg total) by mouth 2 (two) times daily.  60 tablet  6  . carvedilol (COREG) 3.125 MG tablet Take 1 tablet (3.125 mg total) by mouth 2 (two) times daily with a meal.  60 tablet  3  . hydrALAZINE (APRESOLINE) 10 MG tablet Take 2 tablets (20 mg total) by mouth every 8 (eight) hours.  180 tablet  3  . nitroGLYCERIN (NITROSTAT) 0.4 MG SL tablet Place 1 tablet (0.4 mg total) under the tongue every 5 (five) minutes as needed for chest pain.  25 tablet  3  . traZODone (DESYREL) 50 MG tablet Take 150 mg by mouth at bedtime.       . isosorbide mononitrate (IMDUR) 30 MG 24 hr tablet 1/2 tablet (total 15mg ) daily  15 tablet  6  . torsemide (DEMADEX) 20 MG tablet 4 tablets (total 80mg )  daily at the same time in the morning.  120 tablet  3   No current facility-administered medications for this visit.    BP 111/61  Pulse 81  Ht 5\' 9"  (1.753 m)  Wt 66.225 kg (146 lb)  BMI 21.55 kg/m2 General: NAD Neck: JVP 12 cm, no thyromegaly or thyroid nodule.  Lungs: Clear to auscultation bilaterally with normal respiratory effort. CV: Nondisplaced PMI.  Heart regular S1/S2, no S3/S4, 2/6 HSM at apex.  2+ ankle edema.  No carotid bruit.  1+ PT on right, unable to palpate PT on left.  Abdomen: Soft, nontender, no hepatosplenomegaly, no distention.  Skin: Intact without lesions or rashes.  Neurologic: Alert and oriented x 3.  Psych: Normal affect. Extremities: No clubbing or cyanosis.   Assessment/Plan: 1. Atrial  fibrillation: Patient is not in atrial fibrillation today.  He was cardioverted in 6/14.  He will continue apixaban (reduced dose), amiodarone, and Coreg.  Recent TSH and LFTs were normal.  He should have a yearly eye exam.  2. Chronic systolic CHF: EF 16% with severe MR on TEE in 6/14.  He does appear volume overloaded on exam with NYHA class IIIb symptoms.  Diuresis is complicated by his renal function.  Weight is up another 8 lbs despite increasing Lasix recently. - Stop Lasix, will start torsemide 80 mg daily.   - BMET on Monday  - Continue hydralazine/nitrates (no ACEI with renal dysfunction), continue Coreg.  He stopped Imdur due to headaches that really did not seem to change after stopping Imdur.  Therefore, I will have him start back on a low dose of Imdur today, 15 mg daily. - Folllowup 1 week.  - ? Utility of BiV upgrade if he remains extremely symptomatic. 3. CKD: Follow closely with diuresis.  Last creatinine 2.8.  4. Sick sinus syndrome: Medtronic dual chamber pacemaker.  5. Claudication: Bilateral calf pain with ambulation, difficult to feel pulses.  I will arrange for ABIs.   Marca Ancona 12/12/2012

## 2012-12-12 NOTE — Patient Instructions (Addendum)
Stop lasix (furosemide).   Start torsemide 80mg  daily. This will be 4 of a 20mg  tablet daily at the same time in the morning.   Decrease Imdur(isosorbide) to 15mg  daily. This will be 1/2 of a 30mg  tablet.   Your physician recommends that you return for lab work on Monday --BMET.  Your physician has requested that you have a lower extremity arterial duplex. This test is an ultrasound of the arteries in the legs . It looks at arterial blood flow in the legs. Allow one hour for Lower and Upper Arterial scans. There are no restrictions or special instructions.   Your physician recommends that you schedule a follow-up appointment in: 1 week in the Heart Failure Clinic in the [facility] on a day Dr Shirlee Latch is there.

## 2012-12-13 ENCOUNTER — Other Ambulatory Visit: Payer: Self-pay | Admitting: *Deleted

## 2012-12-13 DIAGNOSIS — E876 Hypokalemia: Secondary | ICD-10-CM

## 2012-12-13 MED ORDER — POTASSIUM CHLORIDE CRYS ER 20 MEQ PO TBCR: 20.0000 meq | EXTENDED_RELEASE_TABLET | Freq: Every day | ORAL | Status: AC

## 2012-12-17 ENCOUNTER — Ambulatory Visit (HOSPITAL_COMMUNITY): Payer: Medicare Other | Attending: Cardiology

## 2012-12-17 ENCOUNTER — Other Ambulatory Visit (INDEPENDENT_AMBULATORY_CARE_PROVIDER_SITE_OTHER): Payer: Medicare Other

## 2012-12-17 DIAGNOSIS — I70219 Atherosclerosis of native arteries of extremities with intermittent claudication, unspecified extremity: Secondary | ICD-10-CM

## 2012-12-17 DIAGNOSIS — I4891 Unspecified atrial fibrillation: Secondary | ICD-10-CM

## 2012-12-17 DIAGNOSIS — I5022 Chronic systolic (congestive) heart failure: Secondary | ICD-10-CM

## 2012-12-17 DIAGNOSIS — R209 Unspecified disturbances of skin sensation: Secondary | ICD-10-CM | POA: Insufficient documentation

## 2012-12-17 DIAGNOSIS — I739 Peripheral vascular disease, unspecified: Secondary | ICD-10-CM | POA: Insufficient documentation

## 2012-12-17 DIAGNOSIS — I10 Essential (primary) hypertension: Secondary | ICD-10-CM | POA: Insufficient documentation

## 2012-12-17 DIAGNOSIS — M79609 Pain in unspecified limb: Secondary | ICD-10-CM | POA: Insufficient documentation

## 2012-12-18 LAB — BASIC METABOLIC PANEL
BUN: 60 mg/dL — ABNORMAL HIGH (ref 6–23)
CO2: 28 mEq/L (ref 19–32)
Calcium: 9.2 mg/dL (ref 8.4–10.5)
Creatinine, Ser: 2.9 mg/dL — ABNORMAL HIGH (ref 0.4–1.5)
GFR: 21.74 mL/min — ABNORMAL LOW (ref >60.00)
Glucose, Bld: 98 mg/dL (ref 70–99)
Sodium: 139 mEq/L (ref 135–145)

## 2012-12-19 ENCOUNTER — Ambulatory Visit (HOSPITAL_COMMUNITY)
Admission: RE | Admit: 2012-12-19 | Discharge: 2012-12-19 | Disposition: A | Discharge status: Home / Self Care | Payer: Medicare Other | Source: Ambulatory Visit | Attending: Cardiology | Admitting: Cardiology

## 2012-12-19 ENCOUNTER — Encounter (HOSPITAL_COMMUNITY): Payer: Self-pay

## 2012-12-19 VITALS — BP 92/54 | HR 90 | Wt 141.8 lb

## 2012-12-19 DIAGNOSIS — Z950 Presence of cardiac pacemaker: Secondary | ICD-10-CM | POA: Insufficient documentation

## 2012-12-19 DIAGNOSIS — I739 Peripheral vascular disease, unspecified: Secondary | ICD-10-CM | POA: Insufficient documentation

## 2012-12-19 DIAGNOSIS — J449 Chronic obstructive pulmonary disease, unspecified: Secondary | ICD-10-CM | POA: Insufficient documentation

## 2012-12-19 DIAGNOSIS — N184 Chronic kidney disease, stage 4 (severe): Secondary | ICD-10-CM

## 2012-12-19 DIAGNOSIS — M109 Gout, unspecified: Secondary | ICD-10-CM | POA: Insufficient documentation

## 2012-12-19 DIAGNOSIS — I5022 Chronic systolic (congestive) heart failure: Secondary | ICD-10-CM

## 2012-12-19 DIAGNOSIS — I495 Sick sinus syndrome: Secondary | ICD-10-CM

## 2012-12-19 DIAGNOSIS — I4891 Unspecified atrial fibrillation: Secondary | ICD-10-CM | POA: Insufficient documentation

## 2012-12-19 DIAGNOSIS — I70209 Unspecified atherosclerosis of native arteries of extremities, unspecified extremity: Secondary | ICD-10-CM | POA: Insufficient documentation

## 2012-12-19 DIAGNOSIS — N189 Chronic kidney disease, unspecified: Secondary | ICD-10-CM | POA: Insufficient documentation

## 2012-12-19 DIAGNOSIS — J4489 Other specified chronic obstructive pulmonary disease: Secondary | ICD-10-CM | POA: Insufficient documentation

## 2012-12-19 DIAGNOSIS — Z79899 Other long term (current) drug therapy: Secondary | ICD-10-CM | POA: Insufficient documentation

## 2012-12-19 DIAGNOSIS — I2510 Atherosclerotic heart disease of native coronary artery without angina pectoris: Secondary | ICD-10-CM | POA: Insufficient documentation

## 2012-12-19 DIAGNOSIS — K219 Gastro-esophageal reflux disease without esophagitis: Secondary | ICD-10-CM | POA: Insufficient documentation

## 2012-12-19 LAB — REMOTE PACEMAKER DEVICE
AL IMPEDENCE PM: 400 Ohm
ATRIAL PACING PM: 96.31
RV LEAD IMPEDENCE PM: 340 Ohm
VENTRICULAR PACING PM: 16.68

## 2012-12-19 MED ORDER — ATORVASTATIN CALCIUM 20 MG PO TABS: 20.0000 mg | ORAL_TABLET | Freq: Every day | ORAL | Status: AC

## 2012-12-19 NOTE — Progress Notes (Signed)
Patient ID: Mark Hess, male   DOB: 09/08/1923, 79 y.o.   MRN: [id number] PCP: Dr. Valentina Lucks  79 yo with history of nonischemic cardiomyopathy, CKD, and paroxysmal atrial fibrillation presents for cardiology followup.  He was admitted in 6/14 with progressive exertional dyspnea.  He was in atrial fibrillation with controlled rate.  He was diuresed, and TEE-guided DCCV was done resulting in NSR.  He has been on amiodarone.  In 6/14, he was back in atrial fibrillation and felt "bad" with significant dyspnea.  I cardioverted him again in 6/14 with reloading of amiodarone.  He seems to have been out of atrial fibrillation since that time.  Most recent creatinine was stable at 2.9.    At last appointment, he looked volume overloaded and weight was up significantly.  He was very short of breath.  I stopped Lasix and started him on torsemide 80 mg daily instead.  Since then, he has lost 5 lbs.  He has started to breath better over the last several days.  Orthopnea is resolving.  He is able to walk around his house comfortably but is short of breath when he walks farther.  He also has low back pain and bilateral calf pain after walking 30-50 yards.  This has been going on for a few months. Peripheral arterial dopplers were suggestive of bilateral > 50% SFA stenosis  ECG: A-V sequential pacing   Labs (6/14): K 5.2, creatinine 2.64 => 2.86, HCT 35.5 Labs (7/14): K 4.5, creatinine 2.9 Labs (9/14): K 3.3, creatinine 2.8, BNP 225, LFTs normal, TSH normal Labs (10/14): K 4.1, creatinine 2.9  PMH: 1. Gout 2. CKD 3. Chronic systolic CHF: Nonischemic cardiomyopathy.  LHC 2012 with mild CAD.  Most recent study was a TEE (6/14) showing EF 15%, diffuse hypokinesis, severe MR, moderate to severe LV dilation.  4. Paroxysmal atrial fibrillation on apixaban. DCCV in 6/14.  5. CAD: LHC (2012) with 50% distal RCA stenosis.  6. Sick sinus syndrome with dual chamber Medtronic pacemaker.  7. HTN 8. Type II diabetes 9.  COPD 10. GERD 11. PAD: Peripheral arterial dopplers (10/14) with > 50% bilateral SFA stenosis.   SH: Lives in [place], married, quit smoking in 2009.  FH: Atrial fibrillation, CAD.   ROS: All systems reviewed and negative except as per HPI.   Current Outpatient Prescriptions  Medication Sig Dispense Refill  . allopurinol (ZYLOPRIM) 100 MG tablet Take 1 tablet (100 mg total) by mouth daily.  30 tablet  0  . amiodarone (PACERONE) 200 MG tablet Take 200 mg by mouth daily. one tablet daily      . apixaban (ELIQUIS) 2.5 MG TABS tablet Take 1 tablet (2.5 mg total) by mouth 2 (two) times daily.  60 tablet  6  . carvedilol (COREG) 3.125 MG tablet Take 1 tablet (3.125 mg total) by mouth 2 (two) times daily with a meal.  60 tablet  3  . hydrALAZINE (APRESOLINE) 10 MG tablet Take 2 tablets (20 mg total) by mouth every 8 (eight) hours.  180 tablet  3  . isosorbide mononitrate (IMDUR) 30 MG 24 hr tablet 1/2 tablet (total 15mg ) daily  15 tablet  6  . nitroGLYCERIN (NITROSTAT) 0.4 MG SL tablet Place 1 tablet (0.4 mg total) under the tongue every 5 (five) minutes as needed for chest pain.  25 tablet  3  . potassium chloride SA (K-DUR,KLOR-CON) 20 MEQ tablet Take 1 tablet (20 mEq total) by mouth daily.  30 tablet  6  . torsemide (  DEMADEX) 20 MG tablet 4 tablets (total 80mg ) daily at the same time in the morning.  120 tablet  3  . traZODone (DESYREL) 50 MG tablet Take 150 mg by mouth at bedtime.       Marland Kitchen atorvastatin (LIPITOR) 20 MG tablet Take 1 tablet (20 mg total) by mouth daily.  30 tablet  6   No current facility-administered medications for this encounter.    BP 92/54  Pulse 90  Wt 141 lb 12.8 oz (64.32 kg)  BMI 20.93 kg/m2  SpO2 98% General: NAD Neck: JVP 8-9 cm, no thyromegaly or thyroid nodule.  Lungs: Clear to auscultation bilaterally with normal respiratory effort. CV: Nondisplaced PMI.  Heart regular S1/S2, no S3/S4, 2/6 HSM at apex.  Trace ankle edema.  No carotid bruit.  1+ PT on  right, unable to palpate PT on left.  Abdomen: Soft, nontender, no hepatosplenomegaly, no distention.  Skin: Intact without lesions or rashes.  Neurologic: Alert and oriented x 3.  Psych: Normal affect. Extremities: No clubbing or cyanosis.   Assessment/Plan: 1. Atrial fibrillation: Patient is not in atrial fibrillation today.  He was cardioverted in 6/14.  He will continue apixaban (reduced dose), amiodarone, and Coreg.  Recent TSH and LFTs were normal.  He should have a yearly eye exam.  2. Chronic systolic CHF: EF 16% with severe MR on TEE in 6/14.  Volume status is improved today and he is symptomatically improved though still NYHA class III.  JVP elevated but less so than last appointment.  - Continue torsemide 80 mg daily.   - BMET/BNP in 2 wks.  - Continue hydralazine/nitrates (no ACEI with renal dysfunction), continue Coreg. He is tolerating low dose Imdur.  No titration of meds today with soft BP. - Folllowup 1 week.  - ? Utility of BiV upgrade if he remains extremely symptomatic. 3. CKD: Follow closely with diuresis.  Last creatinine stable at 2.9.  4. Sick sinus syndrome: Medtronic dual chamber pacemaker.  5. Claudication: Bilateral calf pain with ambulation, peripheral arterial dopplers showed > 50% SFA stenosis bilaterally.  I will add a statin, atorvastatin 20 mg daily with lipids/LFTs in 2 months.  He is a poor candidate for invasive evaluation with CKD.  We discussed trying to walk through the pain for at least a short distance.     Marca Ancona 12/19/2012

## 2012-12-19 NOTE — Patient Instructions (Signed)
Start atorvastatin 20 mg daily.  Will need to get labs in 2 weeks at [facility] (heart care).  Follow up 1 month in Heart Failure Clinic at [facility].  Call any issues [phone].  Do the following things EVERYDAY: 1) Weigh yourself in the morning before breakfast. Write it down and keep it in a log. 2) Take your medicines as prescribed 3) Eat low salt foods-Limit salt (sodium) to 2000 mg per day.  4) Stay as active as you can everyday 5) Limit all fluids for the day to less than 2 liters 6)

## 2012-12-21 NOTE — Addendum Note (Signed)
Encounter addended by: Simon Rhein, CCT on: 12/21/2012  8:54 AM<BR>     Documentation filed: Charges VN

## 2013-01-02 ENCOUNTER — Encounter: Payer: Self-pay | Admitting: *Deleted

## 2013-01-02 ENCOUNTER — Ambulatory Visit (INDEPENDENT_AMBULATORY_CARE_PROVIDER_SITE_OTHER): Payer: Medicare Other | Admitting: *Deleted

## 2013-01-02 DIAGNOSIS — I5022 Chronic systolic (congestive) heart failure: Secondary | ICD-10-CM

## 2013-01-02 DIAGNOSIS — I4891 Unspecified atrial fibrillation: Secondary | ICD-10-CM

## 2013-01-02 LAB — BASIC METABOLIC PANEL
CO2: 26 mEq/L (ref 19–32)
Calcium: 9.3 mg/dL (ref 8.4–10.5)
Chloride: 101 mEq/L (ref 96–112)
Creatinine, Ser: 3.3 mg/dL — ABNORMAL HIGH (ref 0.4–1.5)
Glucose, Bld: 176 mg/dL — ABNORMAL HIGH (ref 70–99)
Sodium: 137 mEq/L (ref 135–145)

## 2013-01-02 LAB — BRAIN NATRIURETIC PEPTIDE: Pro B Natriuretic peptide (BNP): 2182 pg/mL — ABNORMAL HIGH (ref 0.0–100.0)

## 2013-01-15 ENCOUNTER — Telehealth: Payer: Self-pay | Admitting: Cardiology

## 2013-01-15 ENCOUNTER — Other Ambulatory Visit: Payer: Self-pay

## 2013-01-15 MED ORDER — CARVEDILOL 3.125 MG PO TABS: 3.1250 mg | ORAL_TABLET | Freq: Two times a day (BID) | ORAL | Status: AC

## 2013-01-15 MED ORDER — HYDRALAZINE HCL 10 MG PO TABS: 20.0000 mg | ORAL_TABLET | Freq: Three times a day (TID) | ORAL | Status: AC

## 2013-01-15 MED ORDER — ALLOPURINOL 100 MG PO TABS: 100.0000 mg | ORAL_TABLET | Freq: Every day | ORAL | Status: AC

## 2013-01-15 NOTE — Telephone Encounter (Signed)
New problem     Pt has been itching for several days.   Pt's wife would like a call back about this and his meds

## 2013-01-15 NOTE — Telephone Encounter (Signed)
No med changes recently.  Not sure that this is a medication.

## 2013-01-15 NOTE — Telephone Encounter (Signed)
Line busy several times

## 2013-01-15 NOTE — Telephone Encounter (Signed)
Pt has increase in general itchiness in the last 1-2 days. He states he has dry skin and is using lotion to help. He has gotten some relief but states it is particularly bad at night. Pt denies a rash,  new medication or other changes (diet /laundry detergent) recently. I will forward to Dr Shirlee Latch for review.

## 2013-01-18 ENCOUNTER — Encounter: Payer: Self-pay | Admitting: Internal Medicine

## 2013-01-21 ENCOUNTER — Ambulatory Visit (HOSPITAL_COMMUNITY)
Admission: RE | Admit: 2013-01-21 | Discharge: 2013-01-21 | Disposition: A | Discharge status: Home / Self Care | Payer: Medicare Other | Source: Ambulatory Visit | Attending: Cardiology | Admitting: Cardiology

## 2013-01-21 VITALS — BP 92/68 | HR 75 | Wt 146.2 lb

## 2013-01-21 DIAGNOSIS — I5022 Chronic systolic (congestive) heart failure: Secondary | ICD-10-CM | POA: Insufficient documentation

## 2013-01-21 LAB — BASIC METABOLIC PANEL
BUN: 62 mg/dL — ABNORMAL HIGH (ref 6–23)
Calcium: 9.4 mg/dL (ref 8.4–10.5)
Chloride: 100 mEq/L (ref 96–112)
Creatinine, Ser: 2.9 mg/dL — ABNORMAL HIGH (ref 0.50–1.35)
GFR calc Af Amer: 21 mL/min — ABNORMAL LOW (ref >90)
GFR calc non Af Amer: 18 mL/min — ABNORMAL LOW (ref >90)
Potassium: 3.6 mEq/L (ref 3.5–5.1)

## 2013-01-21 LAB — PRO B NATRIURETIC PEPTIDE: Pro B Natriuretic peptide (BNP): 16477 pg/mL — ABNORMAL HIGH (ref 0–450)

## 2013-01-21 NOTE — Progress Notes (Signed)
Patient ID: [place], male   DOB: 09-04-1923, 79 y.o.   MRN: [id number] PCP: Dr. Valentina Lucks  79 yo with history of CH due to nonischemic cardiomyopathy EF 15% with severe MR, CKD, and paroxysmal atrial fibrillation.  He was admitted in 6/14 with progressive exertional dyspnea.  He was in atrial fibrillation with controlled rate.  He was diuresed, and underwent successful TEE-guided DCCV.  He has been on amiodarone.  In 6/14, he was back in atrial fibrillation and felt "bad" with significant dyspnea. He was cardioverted again in 6/14 with reloading of amiodarone.  He seems to have been out of atrial fibrillation since that time.  Most recent creatinine was stable at 2.9.    He returns for follow up. Last visit Dr Shirlee Latch lasix was stopped and he switched to torsemide daily. Weight at home 140 pounds.Continues with exertional dyspnea, nor orthopnea or PND. Does complain of dizziness when he stands up. Compliant with medications.   12/17/12  Peripheral arterial dopplers were suggestive of bilateral > 50% SFA stenosis. Start on 20 mg Atorvastatin daily.   Labs (6/14): K 5.2, creatinine 2.64 => 2.86, HCT 35.5 Labs (7/14): K 4.5, creatinine 2.9 Labs (9/14): K 3.3, creatinine 2.8, BNP 225, LFTs normal, TSH normal Labs (10/14): K 4.1, creatinine 2.9 Labs (01/02/13): K 3.7 creatinine 3.3   PMH: 1. Gout 2. CKD 3. Chronic systolic CHF: Nonischemic cardiomyopathy.  LHC 2012 with mild CAD.  Most recent study was a TEE (6/14) showing EF 15%, diffuse hypokinesis, severe MR, moderate to severe LV dilation.  4. Paroxysmal atrial fibrillation on apixaban. DCCV in 6/14.  5. CAD: LHC (2012) with 50% distal RCA stenosis.  6. Sick sinus syndrome with dual chamber Medtronic pacemaker.  7. HTN 8. Type II diabetes 9. COPD 10. GERD 11. PAD: Peripheral arterial dopplers (10/14) with > 50% bilateral SFA stenosis.   SH: [place] in [place], married, quit smoking in 2009.  FH: Atrial fibrillation, CAD.    ROS: All systems reviewed and negative except as per HPI.   Current Outpatient Prescriptions  Medication Sig Dispense Refill  . allopurinol (ZYLOPRIM) 100 MG tablet Take 1 tablet (100 mg total) by mouth daily.  30 tablet  6  . amiodarone (PACERONE) 200 MG tablet Take 200 mg by mouth daily. one tablet daily      . apixaban (ELIQUIS) 2.5 MG TABS tablet Take 1 tablet (2.5 mg total) by mouth 2 (two) times daily.  60 tablet  6  . atorvastatin (LIPITOR) 20 MG tablet Take 1 tablet (20 mg total) by mouth daily.  30 tablet  6  . carvedilol (COREG) 3.125 MG tablet Take 1 tablet (3.125 mg total) by mouth 2 (two) times daily with a meal.  180 tablet  3  . hydrALAZINE (APRESOLINE) 10 MG tablet Take 2 tablets (20 mg total) by mouth every 8 (eight) hours.  180 tablet  3  . isosorbide mononitrate (IMDUR) 30 MG 24 hr tablet 1/2 tablet (total 15mg ) daily  15 tablet  6  . nitroGLYCERIN (NITROSTAT) 0.4 MG SL tablet Place 1 tablet (0.4 mg total) under the tongue every 5 (five) minutes as needed for chest pain.  25 tablet  3  . potassium chloride SA (K-DUR,KLOR-CON) 20 MEQ tablet Take 1 tablet (20 mEq total) by mouth daily.  30 tablet  6  . torsemide (DEMADEX) 20 MG tablet 4 tablets (total 80mg ) daily at the same time in the morning.  120 tablet  3  . traZODone (DESYREL) 50  MG tablet Take 150 mg by mouth at bedtime.        No current facility-administered medications for this encounter.    BP 92/68  Pulse 75  Wt 146 lb 4 oz (66.339 kg)  SpO2 100% Sitting 104/68 Standing 100/68 General: NAD sitting in wheelchair.  Neck: JVP 8-9 cm, no thyromegaly or thyroid nodule.  Lungs: Clear to auscultation bilaterally with normal respiratory effort. CV: Nondisplaced PMI.  Heart regular S1/S2, no S3/S4, 3/6  MR and TR.  Trace ankle edema.  No carotid bruit.  1+ PT on right, unable to palpate PT on left.  Abdomen: Soft, nontender, no hepatosplenomegaly, no distention.  Skin: Intact without lesions or rashes.   Neurologic: Alert and oriented x 3.  Psych: Normal affect. Extremities: No clubbing or cyanosis.   Assessment/Plan: 1. Atrial fibrillation: He was cardioverted in 6/14. Continue apixaban 2.5 mg twice a day. Continue current dose of amiodarone, and Coreg.  TSH and LFTs were normal 11/2012. He should have a yearly eye exam.  2. Chronic systolic CHF: EF 16% with severe MR on TEE in 6/14.  Volume status stable.  -Continue torsemide 80 mg daily. - Continue hydralazine/nitrates (no ACEI with renal dysfunction), continue Coreg. He is tolerating low dose Imdur.  No titration of meds today with soft BP. -Check BMET Pro BNP - ? Utility of BiV upgrade if he remains extremely symptomatic. 3. CKD: Follow closely with diuresis.  Check BMET today.   4. Sick sinus syndrome: Medtronic dual chamber pacemaker.  5. Claudication: Bilateral calf pain with ambulation, peripheral arterial dopplers showed > 50% SFA stenosis bilaterally.  Continue statin, atorvastatin 20 mg daily with lipids/LFTs in 2 months.  He is a poor candidate for invasive evaluation with CKD.  6. Immobility -Will refer to [facility] for PT/OT   Follow up in 6 weeks with Dr Greig Castilla NP-C  01/21/2013  Patient seen and examined with Tonye Becket, NP. We discussed all aspects of the encounter. I agree with the assessment and plan as stated above.   He is relatively stable though clearly end-stage. Volume status looks fine BP too soft too titrate meds much at this point. Main focus needs to be watch volume and kidneys. Check labs today. Agee with home PT/OT referral.   [name] 11:30 AM

## 2013-01-21 NOTE — Patient Instructions (Signed)
Follow up 6 weeks.  Do the following things EVERYDAY: 1) Weigh yourself in the morning before breakfast. Write it down and keep it in a log. 2) Take your medicines as prescribed 3) Eat low salt foods-Limit salt (sodium) to 2000 mg per day.  4) Stay as active as you can everyday 5) Limit all fluids for the day to less than 2 liters  

## 2013-01-23 NOTE — Telephone Encounter (Signed)
Spoke with patient. Pt states he continues to have itching. He has an appt with his PCP in the next day or so and will discuss with his PCP at time of upcoming appt.

## 2013-01-25 ENCOUNTER — Telehealth: Payer: Self-pay | Admitting: Cardiology

## 2013-01-25 NOTE — Telephone Encounter (Signed)
Follow Up ° °Pt returning call for lab results °

## 2013-01-25 NOTE — Telephone Encounter (Signed)
PLEASE RETURN CALL TO SON PARENTS ARE HARD OF HEARING AND HAVE A HARD TIME REMEMBERING THINGS PT WAS SEEN BY PCP (DR. GRIFFIN) LABS WERE DRAWN AND RESULTS CAME BACK ELEVATED. DR.GRIFFIN WOULD LIKE FOR PT TO D/C TORSEMIDE AND ELIQUIS PTS SON HAS NOT D/C ANY MEDS, WANTED TO VERIFY INFORMATION WITH CHF CLINIC AND WOULD REALLY LIKE IF EVERYONE WAS ON THE SAME PAGE

## 2013-01-25 NOTE — Telephone Encounter (Signed)
Will route to CHF clinic

## 2013-01-28 ENCOUNTER — Telehealth (HOSPITAL_COMMUNITY): Payer: Self-pay | Admitting: Cardiology

## 2013-01-28 NOTE — Telephone Encounter (Signed)
Family is still waiting to hear from [facility] for PT/OT

## 2013-01-29 NOTE — Telephone Encounter (Signed)
Have requested Dr Jone Baseman office fax [place] lab results

## 2013-01-29 NOTE — Telephone Encounter (Signed)
F/u with [facility] today they will be contacting pt today or tomorrow

## 2013-03-04 ENCOUNTER — Encounter (HOSPITAL_COMMUNITY): Payer: Self-pay

## 2013-03-04 ENCOUNTER — Ambulatory Visit (HOSPITAL_COMMUNITY)
Admission: RE | Admit: 2013-03-04 | Discharge: 2013-03-04 | Disposition: A | Discharge status: Home / Self Care | Payer: Medicare Other | Source: Ambulatory Visit | Attending: Internal Medicine | Admitting: Internal Medicine

## 2013-03-04 VITALS — BP 102/54 | HR 84 | Wt 137.4 lb

## 2013-03-04 DIAGNOSIS — I509 Heart failure, unspecified: Secondary | ICD-10-CM | POA: Insufficient documentation

## 2013-03-04 DIAGNOSIS — I495 Sick sinus syndrome: Secondary | ICD-10-CM | POA: Insufficient documentation

## 2013-03-04 DIAGNOSIS — I70209 Unspecified atherosclerosis of native arteries of extremities, unspecified extremity: Secondary | ICD-10-CM | POA: Insufficient documentation

## 2013-03-04 DIAGNOSIS — J4489 Other specified chronic obstructive pulmonary disease: Secondary | ICD-10-CM | POA: Insufficient documentation

## 2013-03-04 DIAGNOSIS — Z95810 Presence of automatic (implantable) cardiac defibrillator: Secondary | ICD-10-CM | POA: Insufficient documentation

## 2013-03-04 DIAGNOSIS — I4891 Unspecified atrial fibrillation: Secondary | ICD-10-CM | POA: Insufficient documentation

## 2013-03-04 DIAGNOSIS — I129 Hypertensive chronic kidney disease with stage 1 through stage 4 chronic kidney disease, or unspecified chronic kidney disease: Secondary | ICD-10-CM | POA: Insufficient documentation

## 2013-03-04 DIAGNOSIS — E119 Type 2 diabetes mellitus without complications: Secondary | ICD-10-CM | POA: Insufficient documentation

## 2013-03-04 DIAGNOSIS — J449 Chronic obstructive pulmonary disease, unspecified: Secondary | ICD-10-CM | POA: Insufficient documentation

## 2013-03-04 DIAGNOSIS — Z87891 Personal history of nicotine dependence: Secondary | ICD-10-CM | POA: Insufficient documentation

## 2013-03-04 DIAGNOSIS — N189 Chronic kidney disease, unspecified: Secondary | ICD-10-CM | POA: Insufficient documentation

## 2013-03-04 DIAGNOSIS — I2510 Atherosclerotic heart disease of native coronary artery without angina pectoris: Secondary | ICD-10-CM | POA: Insufficient documentation

## 2013-03-04 DIAGNOSIS — M109 Gout, unspecified: Secondary | ICD-10-CM | POA: Insufficient documentation

## 2013-03-04 DIAGNOSIS — I5022 Chronic systolic (congestive) heart failure: Secondary | ICD-10-CM

## 2013-03-04 DIAGNOSIS — I428 Other cardiomyopathies: Secondary | ICD-10-CM | POA: Insufficient documentation

## 2013-03-04 DIAGNOSIS — K219 Gastro-esophageal reflux disease without esophagitis: Secondary | ICD-10-CM | POA: Insufficient documentation

## 2013-03-04 LAB — BASIC METABOLIC PANEL WITH GFR
BUN: 43 mg/dL — ABNORMAL HIGH (ref 6–23)
CO2: 26 meq/L (ref 19–32)
Calcium: 9.6 mg/dL (ref 8.4–10.5)
Chloride: 101 meq/L (ref 96–112)
Creatinine, Ser: 2.72 mg/dL — ABNORMAL HIGH (ref 0.50–1.35)
GFR calc Af Amer: 23 mL/min — ABNORMAL LOW
GFR calc non Af Amer: 20 mL/min — ABNORMAL LOW
Glucose, Bld: 103 mg/dL — ABNORMAL HIGH (ref 70–99)
Potassium: 4 meq/L (ref 3.5–5.1)
Sodium: 141 meq/L (ref 135–145)

## 2013-03-04 LAB — PRO B NATRIURETIC PEPTIDE: Pro B Natriuretic peptide (BNP): 14539 pg/mL — ABNORMAL HIGH (ref 0–450)

## 2013-03-04 NOTE — Progress Notes (Signed)
Patient ID: [place], male   DOB: 04/02/1923, 79 y.o.   MRN: [id number] PCP: Dr. Valentina Lucks  79 yo with history of CH due to nonischemic cardiomyopathy EF 15% with severe MR, CKD, and paroxysmal atrial fibrillation.  He was admitted in 6/14 with progressive exertional dyspnea.  He was in atrial fibrillation with controlled rate.  He was diuresed, and underwent successful TEE-guided DCCV.  He has been on amiodarone.  In 6/14, he was back in atrial fibrillation and felt "bad" with significant dyspnea. He was cardioverted again in 6/14 with reloading of amiodarone.    He returns for follow up. 5 weeks ago Dr Valentina Lucks stopped apixaban due to bruising. Yesterday he had difficulty taking deep breaths. Denies SOB but says he moves slowly. Complains of fatigue. Dizzy when stands up. Denies orthopnea/PND. Weight at home trending down from 143 to 137 pounds. Compliant with medications. Ambulates with a cane but uses wheelchair for longer distances. Appetite poor.    03/04/13 Activity < 2 hours per day. AV paced no A fib  12/17/12  Peripheral arterial dopplers were suggestive of bilateral > 50% SFA stenosis. Started on 20 mg Atorvastatin daily.   Labs (6/14): K 5.2, creatinine 2.64 => 2.86, HCT 35.5 Labs (7/14): K 4.5, creatinine 2.9 Labs (9/14): K 3.3, creatinine 2.8, BNP 225, LFTs normal, TSH normal Labs (10/14): K 4.1, creatinine 2.9 Labs (01/02/13): K 3.7 creatinine 3.3  Labs 01/21/13: K 3.6 Creatinine 2.9 Pro BNP 16477  PMH: 1. Gout 2. CKD 3. Chronic systolic CHF: Nonischemic cardiomyopathy.  LHC 2012 with mild CAD.  Most recent study was a TEE (6/14) showing EF 15%, diffuse hypokinesis, severe MR, moderate to severe LV dilation.  4. Paroxysmal atrial fibrillation. Not on apixaban due to bruising. DCCV in 6/14.  5. CAD: LHC (2012) with 50% distal RCA stenosis.  6. Sick sinus syndrome with dual chamber Medtronic pacemaker.  7. HTN 8. Type II diabetes 9. COPD 10. GERD 11. PAD: Peripheral  arterial dopplers (10/14) with > 50% bilateral SFA stenosis.   SH: Lives in [place], married, quit smoking in 2009.  FH: Atrial fibrillation, CAD.   ROS: All systems reviewed and negative except as per HPI.   Current Outpatient Prescriptions  Medication Sig Dispense Refill  . allopurinol (ZYLOPRIM) 100 MG tablet Take 1 tablet (100 mg total) by mouth daily.  30 tablet  6  . amiodarone (PACERONE) 200 MG tablet Take 200 mg by mouth daily. one tablet daily      . atorvastatin (LIPITOR) 20 MG tablet Take 1 tablet (20 mg total) by mouth daily.  30 tablet  6  . carvedilol (COREG) 3.125 MG tablet Take 1 tablet (3.125 mg total) by mouth 2 (two) times daily with a meal.  180 tablet  3  . hydrALAZINE (APRESOLINE) 10 MG tablet Take 2 tablets (20 mg total) by mouth every 8 (eight) hours.  180 tablet  3  . isosorbide mononitrate (IMDUR) 30 MG 24 hr tablet 1/2 tablet (total 15mg ) daily  15 tablet  6  . nitroGLYCERIN (NITROSTAT) 0.4 MG SL tablet Place 1 tablet (0.4 mg total) under the tongue every 5 (five) minutes as needed for chest pain.  25 tablet  3  . potassium chloride SA (K-DUR,KLOR-CON) 20 MEQ tablet Take 1 tablet (20 mEq total) by mouth daily.  30 tablet  6  . torsemide (DEMADEX) 20 MG tablet 4 tablets (total 80mg ) daily at the same time in the morning.  120 tablet  3  .  traZODone (DESYREL) 50 MG tablet Take 150 mg by mouth at bedtime.        No current facility-administered medications for this encounter.    BP 102/54  Pulse 84  Wt 137 lb 6.4 oz (62.324 kg)  SpO2 96%  General: NAD Arrived in wheelchair.  Neck: JVP 7-8, no thyromegaly or thyroid nodule.  Lungs: Clear to auscultation bilaterally with normal respiratory effort. CV: Nondisplaced PMI.  Heart regular S1/S2, no S3/S4, 3/6  MR and TR.    No carotid bruit.  1+ PT on right, unable to palpate PT on left.  Abdomen: Soft, nontender, no hepatosplenomegaly, no distention.  Skin: Intact without lesions or rashes.  Neurologic:  Alert and oriented x 3.  Psych: Normal affect. Extremities: No clubbing or cyanosis.   EKG : AV paced 81 bpm   Assessment/Plan: 1. Atrial fibrillation: S/P cardioversion in June. Apixaban stopped 5 weeks ago due bruising by his PCP. Continue current dose of amiodarone, and Coreg.  TSH and LFTs were normal 11/2012.  2. Chronic systolic CHF: 08/27/12 ECHO  EF 15% with severe MR on TEE in 6/14.  NYHA IV. Functional decline over the last few months. Volume status low . Hold torsemide for the next 2 days. Then restart 60 mg torsemide only for weight 140 pounds or greater. Hold potassium if he hold torsemide.  - Continue hydralazine/nitrates (no ACEI with renal dysfunction), continue Coreg.  No titration of meds today with soft BP. Discussed end of life and his expectations. Offered Hospice and he would like to pursue.  3. CKD:   Check BMET today.    Follow up 3 months  CLEGG,AMY NP-C  03/04/2013  Patient seen and examined with Tonye Becket, NP. We discussed all aspects of the encounter. I agree with the assessment and plan as stated above.   He continues with progressive functional decline particularly over the past 3 months. We interrogated his device and it confirms that his activity level has steadily declined since October. On exam, he does not have any significant volume overload and thus I suspect he has low output HF.   I had an extensive discussion with [place] and his daughter-in-law (a former Physiological scientist) about his situation and suggested he consider being evaluated by Hospice. They have agreed to this and we have arranged.  Will check labs today, if renal function stable would keep diuretic dosing as it is.   Esley Brooking,MD 2:56 PM

## 2013-03-04 NOTE — Addendum Note (Signed)
Encounter addended by: Dolores Patty, MD on: 03/04/2013  2:58 PM<BR>     Documentation filed: Charges VN

## 2013-03-04 NOTE — Patient Instructions (Addendum)
Follow up in 1 month  You have been referred to [facility]  WE WILL CALL YOU WITH LAB RESULTS, IF KIDNEY FUNCTION ELEVATED WILL Hold torsemide for the 2 days  Take 60 mg  Daily only if your weight is 140 pounds or greater.   Hold potassium if you hold torsemide.   Do the following things EVERYDAY: 1) Weigh yourself in the morning before breakfast. Write it down and keep it in a log. 2) Take your medicines as prescribed 3) Eat low salt foods-Limit salt (sodium) to 2000 mg per day.  4) Stay as active as you can everyday 5) Limit all fluids for the day to less than 2 liters

## 2013-03-12 ENCOUNTER — Telehealth (HOSPITAL_COMMUNITY): Payer: Self-pay | Admitting: Cardiology

## 2013-03-14 NOTE — Telephone Encounter (Signed)
Pt is interested in turning off defibrillator/pace maker.   Please advise

## 2013-03-14 NOTE — Telephone Encounter (Signed)
Hospice just called to inform us pt just passed away

## 2013-03-14 DEATH — deceased

## 2013-03-18 ENCOUNTER — Telehealth: Payer: Self-pay

## 2013-03-18 ENCOUNTER — Encounter: Payer: Medicare Other | Admitting: *Deleted

## 2013-03-18 NOTE — Telephone Encounter (Signed)
Patient past away @ Home per Obituary in [place] [employer] °

## 2013-03-27 ENCOUNTER — Encounter: Payer: Self-pay | Admitting: *Deleted

## 2013-04-04 ENCOUNTER — Encounter (HOSPITAL_COMMUNITY): Payer: Medicare Other

## 2013-08-05 IMAGING — CR DG CHEST 1V PORT
1 series · 1 of 1 positions shown · non-contrast
Comparison: Prior chest x-ray 05/09/2012

CLINICAL DATA: Short of breath

PORTABLE CHEST - 1 VIEW

[AP]
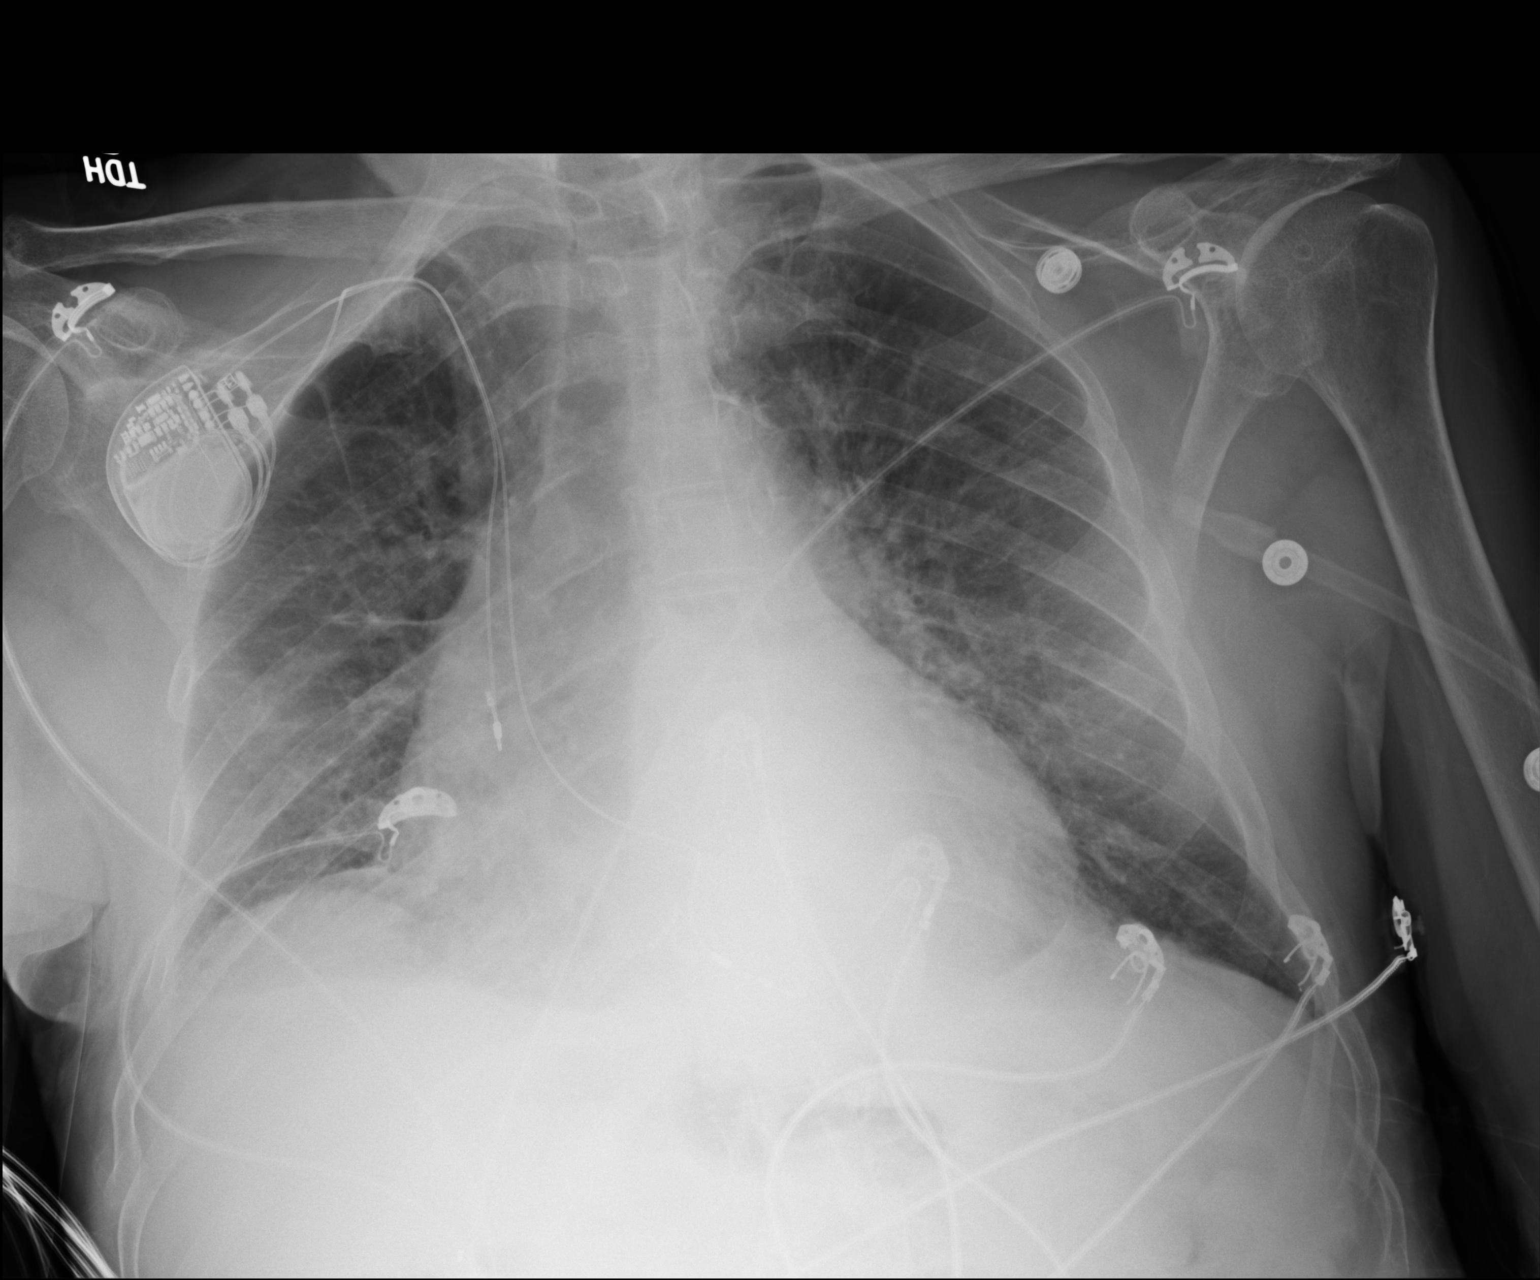

[1 of 1 positions shown; findings below may reference images not displayed]

FINDINGS: Stable position of right subclavian approach cardiac
rhythm maintenance device with leads projecting over the right
atrium and right ventricle.  Slightly increased pulmonary vascular
congestion now with mild interstitial edema.  Small right pleural
effusion and associated right basilar opacity.  Slightly increased
enlargement the cardiopericardial silhouette.  Atherosclerotic
calcifications again noted the transverse aorta.  No acute osseous
abnormality on this single view.  No pneumothorax.
IMPRESSION: Mild CHF with increased prominence of the cardiac silhouette, mild
interstitial edema and small right pleural effusion with associated
right basilar atelectasis.
